# Patient Record
Sex: Female | Born: 1935 | Race: Black or African American | Hispanic: No | Smoking: Former smoker
Health system: Southern US, Community
[De-identification: ages and names within clinical notes are randomized; demographics above are authoritative.]

## PROBLEM LIST (undated history)

## (undated) DIAGNOSIS — E039 Hypothyroidism, unspecified: Secondary | ICD-10-CM

## (undated) DIAGNOSIS — G4733 Obstructive sleep apnea (adult) (pediatric): Secondary | ICD-10-CM

## (undated) DIAGNOSIS — N959 Unspecified menopausal and perimenopausal disorder: Secondary | ICD-10-CM

## (undated) DIAGNOSIS — K219 Gastro-esophageal reflux disease without esophagitis: Secondary | ICD-10-CM

## (undated) DIAGNOSIS — G4730 Sleep apnea, unspecified: Secondary | ICD-10-CM

## (undated) DIAGNOSIS — I10 Essential (primary) hypertension: Secondary | ICD-10-CM

## (undated) DIAGNOSIS — Z5189 Encounter for other specified aftercare: Secondary | ICD-10-CM

## (undated) DIAGNOSIS — M169 Osteoarthritis of hip, unspecified: Secondary | ICD-10-CM

## (undated) DIAGNOSIS — B029 Zoster without complications: Secondary | ICD-10-CM

## (undated) DIAGNOSIS — C3491 Malignant neoplasm of unspecified part of right bronchus or lung: Principal | ICD-10-CM

## (undated) DIAGNOSIS — E559 Vitamin D deficiency, unspecified: Secondary | ICD-10-CM

## (undated) DIAGNOSIS — S329XXA Fracture of unspecified parts of lumbosacral spine and pelvis, initial encounter for closed fracture: Secondary | ICD-10-CM

## (undated) DIAGNOSIS — E785 Hyperlipidemia, unspecified: Secondary | ICD-10-CM

## (undated) HISTORY — DX: Hyperlipidemia, unspecified: E78.5

## (undated) HISTORY — DX: Vitamin D deficiency, unspecified: E55.9

## (undated) HISTORY — PX: ROTATOR CUFF REPAIR: SHX139

## (undated) HISTORY — DX: Obstructive sleep apnea (adult) (pediatric): G47.33

## (undated) HISTORY — DX: Hypothyroidism, unspecified: E03.9

## (undated) HISTORY — DX: Gastro-esophageal reflux disease without esophagitis: K21.9

## (undated) HISTORY — DX: Fracture of unspecified parts of lumbosacral spine and pelvis, initial encounter for closed fracture: S32.9XXA

## (undated) HISTORY — DX: Essential (primary) hypertension: I10

## (undated) HISTORY — DX: Malignant neoplasm of unspecified part of right bronchus or lung: C34.91

## (undated) HISTORY — DX: Encounter for other specified aftercare: Z51.89

## (undated) HISTORY — PX: ABDOMINAL HYSTERECTOMY: SHX81

## (undated) HISTORY — PX: COLONOSCOPY: SHX174

## (undated) HISTORY — DX: Sleep apnea, unspecified: G47.30

## (undated) HISTORY — DX: Osteoarthritis of hip, unspecified: M16.9

## (undated) HISTORY — DX: Unspecified menopausal and perimenopausal disorder: N95.9

---

## 1961-12-29 HISTORY — PX: TOTAL ABDOMINAL HYSTERECTOMY W/ BILATERAL SALPINGOOPHORECTOMY: SHX83

## 1961-12-29 HISTORY — PX: LEG SURGERY: SHX1003

## 1999-05-15 ENCOUNTER — Ambulatory Visit (HOSPITAL_COMMUNITY): Admission: RE | Admit: 1999-05-15 | Discharge: 1999-05-15 | Payer: Self-pay | Admitting: Obstetrics and Gynecology

## 1999-05-27 ENCOUNTER — Ambulatory Visit (HOSPITAL_COMMUNITY): Admission: RE | Admit: 1999-05-27 | Discharge: 1999-05-27 | Payer: Self-pay | Admitting: Internal Medicine

## 1999-05-27 ENCOUNTER — Encounter: Payer: Self-pay | Admitting: Internal Medicine

## 1999-10-09 ENCOUNTER — Ambulatory Visit (HOSPITAL_COMMUNITY): Admission: RE | Admit: 1999-10-09 | Discharge: 1999-10-09 | Payer: Self-pay | Admitting: *Deleted

## 1999-11-17 ENCOUNTER — Emergency Department (HOSPITAL_COMMUNITY): Admission: EM | Admit: 1999-11-17 | Discharge: 1999-11-17 | Payer: Self-pay | Admitting: Internal Medicine

## 2000-10-28 LAB — HM COLONOSCOPY: HM Colonoscopy: NORMAL

## 2001-12-06 ENCOUNTER — Ambulatory Visit (HOSPITAL_COMMUNITY): Admission: RE | Admit: 2001-12-06 | Discharge: 2001-12-06 | Payer: Self-pay | Admitting: Internal Medicine

## 2001-12-06 ENCOUNTER — Encounter: Payer: Self-pay | Admitting: Internal Medicine

## 2002-04-04 ENCOUNTER — Encounter: Payer: Self-pay | Admitting: Specialist

## 2002-04-11 ENCOUNTER — Inpatient Hospital Stay (HOSPITAL_COMMUNITY): Admission: RE | Admit: 2002-04-11 | Discharge: 2002-04-12 | Payer: Self-pay | Admitting: Specialist

## 2003-01-03 ENCOUNTER — Ambulatory Visit (HOSPITAL_COMMUNITY): Admission: RE | Admit: 2003-01-03 | Discharge: 2003-01-03 | Payer: Self-pay | Admitting: Internal Medicine

## 2003-01-03 ENCOUNTER — Encounter: Payer: Self-pay | Admitting: Internal Medicine

## 2003-04-04 ENCOUNTER — Encounter: Payer: Self-pay | Admitting: Internal Medicine

## 2003-04-04 ENCOUNTER — Encounter: Admission: RE | Admit: 2003-04-04 | Discharge: 2003-04-04 | Payer: Self-pay | Admitting: Internal Medicine

## 2003-04-06 ENCOUNTER — Encounter: Admission: RE | Admit: 2003-04-06 | Discharge: 2003-04-06 | Payer: Self-pay | Admitting: Internal Medicine

## 2003-04-06 ENCOUNTER — Encounter: Payer: Self-pay | Admitting: Internal Medicine

## 2003-07-30 HISTORY — PX: LUMBAR DISC SURGERY: SHX700

## 2003-08-23 ENCOUNTER — Encounter: Payer: Self-pay | Admitting: Neurosurgery

## 2003-08-25 ENCOUNTER — Encounter: Payer: Self-pay | Admitting: Neurosurgery

## 2003-08-25 ENCOUNTER — Inpatient Hospital Stay (HOSPITAL_COMMUNITY): Admission: RE | Admit: 2003-08-25 | Discharge: 2003-08-27 | Payer: Self-pay | Admitting: Neurosurgery

## 2004-03-12 ENCOUNTER — Ambulatory Visit (HOSPITAL_COMMUNITY): Admission: RE | Admit: 2004-03-12 | Discharge: 2004-03-12 | Payer: Self-pay | Admitting: Internal Medicine

## 2004-12-12 ENCOUNTER — Ambulatory Visit: Payer: Self-pay | Admitting: Internal Medicine

## 2004-12-25 ENCOUNTER — Ambulatory Visit: Payer: Self-pay | Admitting: Pulmonary Disease

## 2004-12-25 ENCOUNTER — Ambulatory Visit (HOSPITAL_BASED_OUTPATIENT_CLINIC_OR_DEPARTMENT_OTHER): Admission: RE | Admit: 2004-12-25 | Discharge: 2004-12-25 | Payer: Self-pay | Admitting: Internal Medicine

## 2005-01-21 ENCOUNTER — Ambulatory Visit: Payer: Self-pay | Admitting: Pulmonary Disease

## 2005-06-10 ENCOUNTER — Ambulatory Visit: Payer: Self-pay | Admitting: Internal Medicine

## 2005-10-01 ENCOUNTER — Ambulatory Visit: Payer: Self-pay | Admitting: Internal Medicine

## 2005-10-13 ENCOUNTER — Ambulatory Visit: Payer: Self-pay

## 2006-02-27 ENCOUNTER — Ambulatory Visit: Payer: Self-pay | Admitting: Internal Medicine

## 2006-09-28 ENCOUNTER — Ambulatory Visit (HOSPITAL_COMMUNITY): Admission: RE | Admit: 2006-09-28 | Discharge: 2006-09-28 | Payer: Self-pay | Admitting: Internal Medicine

## 2006-11-18 ENCOUNTER — Ambulatory Visit: Payer: Self-pay | Admitting: Internal Medicine

## 2007-10-12 DIAGNOSIS — E039 Hypothyroidism, unspecified: Secondary | ICD-10-CM

## 2007-10-12 DIAGNOSIS — I10 Essential (primary) hypertension: Secondary | ICD-10-CM

## 2007-10-12 DIAGNOSIS — E7800 Pure hypercholesterolemia, unspecified: Secondary | ICD-10-CM

## 2007-10-12 HISTORY — DX: Essential (primary) hypertension: I10

## 2007-10-12 HISTORY — DX: Hypothyroidism, unspecified: E03.9

## 2008-01-27 ENCOUNTER — Ambulatory Visit: Payer: Self-pay | Admitting: Internal Medicine

## 2008-01-27 DIAGNOSIS — R21 Rash and other nonspecific skin eruption: Secondary | ICD-10-CM

## 2008-01-27 DIAGNOSIS — M79609 Pain in unspecified limb: Secondary | ICD-10-CM | POA: Insufficient documentation

## 2008-01-27 DIAGNOSIS — G4733 Obstructive sleep apnea (adult) (pediatric): Secondary | ICD-10-CM

## 2008-01-27 HISTORY — DX: Obstructive sleep apnea (adult) (pediatric): G47.33

## 2008-01-30 DIAGNOSIS — K219 Gastro-esophageal reflux disease without esophagitis: Secondary | ICD-10-CM

## 2008-01-30 DIAGNOSIS — E785 Hyperlipidemia, unspecified: Secondary | ICD-10-CM | POA: Insufficient documentation

## 2008-01-30 DIAGNOSIS — F411 Generalized anxiety disorder: Secondary | ICD-10-CM | POA: Insufficient documentation

## 2008-01-30 DIAGNOSIS — J309 Allergic rhinitis, unspecified: Secondary | ICD-10-CM | POA: Insufficient documentation

## 2008-01-30 HISTORY — DX: Gastro-esophageal reflux disease without esophagitis: K21.9

## 2008-01-30 HISTORY — DX: Hyperlipidemia, unspecified: E78.5

## 2008-02-24 ENCOUNTER — Ambulatory Visit (HOSPITAL_COMMUNITY): Admission: RE | Admit: 2008-02-24 | Discharge: 2008-02-24 | Payer: Self-pay | Admitting: Internal Medicine

## 2008-04-05 ENCOUNTER — Encounter: Payer: Self-pay | Admitting: Internal Medicine

## 2008-08-17 ENCOUNTER — Ambulatory Visit: Payer: Self-pay | Admitting: Internal Medicine

## 2008-08-17 DIAGNOSIS — N951 Menopausal and female climacteric states: Secondary | ICD-10-CM

## 2008-08-17 DIAGNOSIS — M25579 Pain in unspecified ankle and joints of unspecified foot: Secondary | ICD-10-CM | POA: Insufficient documentation

## 2008-08-17 DIAGNOSIS — R42 Dizziness and giddiness: Secondary | ICD-10-CM | POA: Insufficient documentation

## 2008-10-02 ENCOUNTER — Encounter: Payer: Self-pay | Admitting: Internal Medicine

## 2008-12-04 ENCOUNTER — Ambulatory Visit: Payer: Self-pay | Admitting: Internal Medicine

## 2008-12-04 DIAGNOSIS — R5381 Other malaise: Secondary | ICD-10-CM

## 2008-12-04 DIAGNOSIS — R5383 Other fatigue: Secondary | ICD-10-CM

## 2008-12-04 DIAGNOSIS — N959 Unspecified menopausal and perimenopausal disorder: Secondary | ICD-10-CM | POA: Insufficient documentation

## 2008-12-04 HISTORY — DX: Unspecified menopausal and perimenopausal disorder: N95.9

## 2008-12-05 LAB — CONVERTED CEMR LAB
AST: 24 units/L (ref 0–37)
Albumin: 3.7 g/dL (ref 3.5–5.2)
Basophils Relative: 1 % (ref 0.0–3.0)
CO2: 31 meq/L (ref 19–32)
Direct LDL: 123.2 mg/dL
GFR calc Af Amer: 105 mL/min
GFR calc non Af Amer: 87 mL/min
Glucose, Bld: 94 mg/dL (ref 70–99)
HCT: 42.7 % (ref 36.0–46.0)
HDL: 75.8 mg/dL (ref >39.0)
Hemoglobin: 14.2 g/dL (ref 12.0–15.0)
MCHC: 33.4 g/dL (ref 30.0–36.0)
MCV: 93.5 fL (ref 78.0–100.0)
Neutro Abs: 2.2 10*3/uL (ref 1.4–7.7)
Neutrophils Relative %: 39.6 % — ABNORMAL LOW (ref 43.0–77.0)
Potassium: 4.1 meq/L (ref 3.5–5.1)
RBC: 4.56 M/uL (ref 3.87–5.11)
Sed Rate: 17 mm/hr (ref 0–22)
Sodium: 142 meq/L (ref 135–145)
Specific Gravity, Urine: <=1.005 (ref 1.000–1.03)
Total Bilirubin: 0.9 mg/dL (ref 0.3–1.2)
Total CHOL/HDL Ratio: 2.7
Total Protein, Urine: NEGATIVE mg/dL
Urine Glucose: NEGATIVE mg/dL
Vit D, 1,25-Dihydroxy: 17 — ABNORMAL LOW (ref 30–89)
pH: 6.5 (ref 5.0–8.0)

## 2008-12-20 ENCOUNTER — Ambulatory Visit: Payer: Self-pay | Admitting: Family Medicine

## 2008-12-20 ENCOUNTER — Encounter: Payer: Self-pay | Admitting: Internal Medicine

## 2009-04-02 ENCOUNTER — Ambulatory Visit: Payer: Self-pay | Admitting: Internal Medicine

## 2009-04-02 DIAGNOSIS — J019 Acute sinusitis, unspecified: Secondary | ICD-10-CM | POA: Insufficient documentation

## 2009-04-10 ENCOUNTER — Ambulatory Visit (HOSPITAL_COMMUNITY): Admission: RE | Admit: 2009-04-10 | Discharge: 2009-04-10 | Payer: Self-pay | Admitting: Internal Medicine

## 2009-04-10 LAB — HM MAMMOGRAPHY: HM Mammogram: NEGATIVE

## 2009-06-26 ENCOUNTER — Ambulatory Visit: Payer: Self-pay | Admitting: Internal Medicine

## 2009-06-26 DIAGNOSIS — R109 Unspecified abdominal pain: Secondary | ICD-10-CM | POA: Insufficient documentation

## 2009-06-29 ENCOUNTER — Ambulatory Visit: Payer: Self-pay | Admitting: Internal Medicine

## 2009-06-29 DIAGNOSIS — M169 Osteoarthritis of hip, unspecified: Secondary | ICD-10-CM

## 2009-06-29 DIAGNOSIS — S329XXA Fracture of unspecified parts of lumbosacral spine and pelvis, initial encounter for closed fracture: Secondary | ICD-10-CM | POA: Insufficient documentation

## 2009-06-29 DIAGNOSIS — M1610 Unilateral primary osteoarthritis, unspecified hip: Secondary | ICD-10-CM

## 2009-06-29 HISTORY — DX: Fracture of unspecified parts of lumbosacral spine and pelvis, initial encounter for closed fracture: S32.9XXA

## 2009-06-29 HISTORY — DX: Unilateral primary osteoarthritis, unspecified hip: M16.10

## 2009-06-29 HISTORY — DX: Osteoarthritis of hip, unspecified: M16.9

## 2009-12-26 ENCOUNTER — Ambulatory Visit: Payer: Self-pay | Admitting: Internal Medicine

## 2009-12-26 DIAGNOSIS — E559 Vitamin D deficiency, unspecified: Secondary | ICD-10-CM | POA: Insufficient documentation

## 2009-12-26 HISTORY — DX: Vitamin D deficiency, unspecified: E55.9

## 2009-12-27 LAB — CONVERTED CEMR LAB
AST: 20 units/L (ref 0–37)
Alkaline Phosphatase: 49 units/L (ref 39–117)
BUN: 15 mg/dL (ref 6–23)
Basophils Absolute: 0.1 10*3/uL (ref 0.0–0.1)
Calcium: 9.5 mg/dL (ref 8.4–10.5)
Cholesterol: 189 mg/dL (ref 0–200)
Folate: 15.5 ng/mL
GFR calc non Af Amer: 90.01 mL/min (ref >60)
Glucose, Bld: 83 mg/dL (ref 70–99)
Hemoglobin: 14 g/dL (ref 12.0–15.0)
Lymphocytes Relative: 40.5 % (ref 12.0–46.0)
Monocytes Relative: 8.8 % (ref 3.0–12.0)
Platelets: 206 10*3/uL (ref 150.0–400.0)
RDW: 12.7 % (ref 11.5–14.6)
Saturation Ratios: 29.4 % (ref 20.0–50.0)
Sodium: 141 meq/L (ref 135–145)
Specific Gravity, Urine: <=1.005 (ref 1.000–1.030)
Total Bilirubin: 0.8 mg/dL (ref 0.3–1.2)
Total Protein, Urine: NEGATIVE mg/dL
Transferrin: 228.1 mg/dL (ref 212.0–360.0)
Urine Glucose: NEGATIVE mg/dL
Urobilinogen, UA: 0.2 (ref 0.0–1.0)
Vitamin B-12: 336 pg/mL (ref 211–911)
WBC: 6.6 10*3/uL (ref 4.5–10.5)

## 2010-09-05 ENCOUNTER — Ambulatory Visit: Payer: Self-pay | Admitting: Internal Medicine

## 2010-09-05 DIAGNOSIS — M279 Disease of jaws, unspecified: Secondary | ICD-10-CM | POA: Insufficient documentation

## 2010-09-05 DIAGNOSIS — R252 Cramp and spasm: Secondary | ICD-10-CM | POA: Insufficient documentation

## 2010-09-06 ENCOUNTER — Telehealth (INDEPENDENT_AMBULATORY_CARE_PROVIDER_SITE_OTHER): Payer: Self-pay | Admitting: *Deleted

## 2010-09-11 ENCOUNTER — Encounter: Payer: Self-pay | Admitting: Internal Medicine

## 2010-09-12 ENCOUNTER — Ambulatory Visit: Payer: Self-pay

## 2010-09-12 ENCOUNTER — Encounter: Payer: Self-pay | Admitting: Internal Medicine

## 2010-11-26 ENCOUNTER — Encounter: Payer: Self-pay | Admitting: Gastroenterology

## 2010-12-03 ENCOUNTER — Ambulatory Visit: Payer: Self-pay | Admitting: Internal Medicine

## 2010-12-03 LAB — CONVERTED CEMR LAB
BUN: 16 mg/dL (ref 6–23)
Basophils Absolute: 0.1 10*3/uL (ref 0.0–0.1)
Bilirubin, Direct: 0.1 mg/dL (ref 0.0–0.3)
Chloride: 107 meq/L (ref 96–112)
Cholesterol: 190 mg/dL (ref 0–200)
Creatinine, Ser: 0.7 mg/dL (ref 0.4–1.2)
Eosinophils Absolute: 0.2 10*3/uL (ref 0.0–0.7)
Eosinophils Relative: 3.4 % (ref 0.0–5.0)
LDL Cholesterol: 101 mg/dL — ABNORMAL HIGH (ref 0–99)
Leukocytes, UA: NEGATIVE
MCHC: 34.1 g/dL (ref 30.0–36.0)
MCV: 94.3 fL (ref 78.0–100.0)
Monocytes Absolute: 0.5 10*3/uL (ref 0.1–1.0)
Neutrophils Relative %: 34.6 % — ABNORMAL LOW (ref 43.0–77.0)
Nitrite: NEGATIVE
Platelets: 199 10*3/uL (ref 150.0–400.0)
RDW: 13.4 % (ref 11.5–14.6)
Specific Gravity, Urine: 1.015 (ref 1.000–1.030)
Total Bilirubin: 0.9 mg/dL (ref 0.3–1.2)
Total Protein, Urine: NEGATIVE mg/dL
Triglycerides: 84 mg/dL (ref 0.0–149.0)
WBC: 5.7 10*3/uL (ref 4.5–10.5)
pH: 6 (ref 5.0–8.0)

## 2010-12-04 LAB — CONVERTED CEMR LAB: Vit D, 25-Hydroxy: 33 ng/mL (ref 30–89)

## 2010-12-12 ENCOUNTER — Ambulatory Visit: Payer: Self-pay | Admitting: Internal Medicine

## 2011-01-28 NOTE — Progress Notes (Signed)
----   Converted from flag ---- ---- 09/05/2010 1:13 PM, Edman Circle wrote: APPT 9/14 @ 4:00  ---- 09/05/2010 12:14 PM, Dagoberto Reef wrote: Thanks  ---- 09/05/2010 11:58 AM, Corwin Levins MD wrote: The following orders have been entered for this patient and placed on Admin Hold:  Type:     Referral       Code:   Radiology Description:   Radiology Referral Order Date:   09/05/2010   Authorized By:   Corwin Levins MD Order #:   [id number] Clinical Notes:   Name of Test or Procedure: carotid dopplers  Of What:  Special Instructions ------------------------------

## 2011-01-28 NOTE — Miscellaneous (Signed)
Summary: Orders Update  Clinical Lists Changes  Problems: Added new problem of DIZZINESS (ICD-780.4) Orders: Added new Test order of Carotid Duplex (Carotid Duplex) - Signed 
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
## 2011-01-28 NOTE — Letter (Signed)
Summary: Colonoscopy Letter  [facility]  [address]   [place], [place] [address]   Phone: [phone]  Fax: [phone]      November 26, 2010 MRN: [id number]   [facility] [address] [place], [place]  [address]   Dear Ms. Herda,   According to your medical record, it is time for you to schedule a Colonoscopy. The American Cancer Society recommends this procedure as a method to detect early colon cancer. Patients with a family history of colon cancer, or a personal history of colon polyps or inflammatory bowel disease are at increased risk.  This letter has been generated based on the recommendations made at the time of your procedure. If you feel that in your particular situation this may no longer apply, please contact our office.  Please call our office at [phone] to schedule this appointment or to update your records at your earliest convenience.  Thank you for cooperating with [place] to provide you with the very best care possible.   Sincerely,   Barbette Hair. Arlyce Dice, M.D.  [facility] [phone]
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
## 2011-03-24 ENCOUNTER — Ambulatory Visit (INDEPENDENT_AMBULATORY_CARE_PROVIDER_SITE_OTHER): Payer: Medicare Other | Admitting: Endocrinology

## 2011-03-24 ENCOUNTER — Encounter: Payer: Self-pay | Admitting: Endocrinology

## 2011-03-24 ENCOUNTER — Other Ambulatory Visit: Payer: Medicare Other

## 2011-03-24 VITALS — BP 118/82 | HR 75 | Temp 98.6°F | Ht 60.0 in | Wt 126.8 lb

## 2011-03-24 DIAGNOSIS — N3000 Acute cystitis without hematuria: Secondary | ICD-10-CM | POA: Insufficient documentation

## 2011-03-24 LAB — POCT URINALYSIS DIPSTICK
Bilirubin, UA: NEGATIVE
Glucose, UA: NEGATIVE
Nitrite, UA: NEGATIVE
Spec Grav, UA: 1.02
Urobilinogen, UA: 0.2
pH, UA: 6

## 2011-03-24 MED ORDER — CIPROFLOXACIN HCL 500 MG PO TABS: 500.0000 mg | ORAL_TABLET | Freq: Two times a day (BID) | ORAL | Status: AC
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
## 2011-03-24 NOTE — Patient Instructions (Signed)
i have sent a prescription for "cipro" to [facility]. Please call in 4 days if your symptoms are not better

## 2011-03-24 NOTE — Progress Notes (Signed)
Addended by: Romero Belling on: 03/24/2011 02:30 PM   Modules accepted: Orders

## 2011-05-16 NOTE — Op Note (Signed)
[facility]  Patient:    [name], [name] Visit Number: [id number] MRN: [id number]          Service Type: SUR Location: 4W [id number] 02 Attending Physician:  Rocky Crafts Dictated by:   Michael Litter. Montez Morita, M.D. Proc. Date: 04/11/02 Admit Date:  04/11/2002                             Operative Report  PREOPERATIVE DIAGNOSIS:  Rotator cuff tear of right shoulder.  POSTOPERATIVE DIAGNOSIS:  Rotator cuff tear of right shoulder.  OPERATION PROCEDURE:  Anterior acromioplasty and rotator cuff repair.  SURGEON:  Philips J. Montez Morita, M.D.  ASSISTANT:  Ottie Glazier. Wynona Neat, P.A.-C.  DESCRIPTION OF PROCEDURE:  After suitable general anesthesia, he was positioned on the shoulder frame, prepped and draped routinely.  An incision was made over the anterior acromion extending from the distal clavicle to an inch to the deltoid.  The deltoid is removed from the anterior acromion along with the coracoacromial ligament.  A saw was used to bevel back the anterior acromion.  The coracoacromial ligament is then loosened distally with a Bovie of its attachment to the coracoid for ________ reattachment and lengthening. The cuff repair involved the supraspinatus, coming from the biceps tendon which was exposed distally with a little piece of it remaining on the distal area.  The area of the bone was denuded with the bur.  Two drill holes were placed and connected with two other drill holds and a towel clip.  A locking mattress suture was placed in the tendon and brought out and pulled down until it trough very nicely with #1 Ethibond.  A #1 PDS was used to repair the deltoid back to the acromion through two drill holes with two sutures through each drill hole, and in the deltoid roof, some 2-0 coated Vicryl in the subcutaneous and Monocryl on the skin.  Tolerated well and goes to recovery in an abduction pillow. Dictated by:   [employer]. Montez Morita, M.D. Attending Physician:   Rocky Crafts DD:  04/11/02 TD:  04/11/02 Job: [id number]

## 2011-05-16 NOTE — Op Note (Signed)
[name], [name]                        ACCOUNT NO.:  [id number]   MEDICAL RECORD NO.:  [id number]                   PATIENT TYPE:  INP   LOCATION:  2899                                 FACILITY:  [facility]   PHYSICIAN:  [name]. [name], M.D.               DATE OF BIRTH:  02/09/1933   DATE OF PROCEDURE:  08/25/2003  DATE OF DISCHARGE:                                 OPERATIVE REPORT   PREOPERATIVE DIAGNOSIS:  Herniated lumbar disk L5-S1 left with spondylosis,  stenosis, degenerative disk disease, and lumbar radiculopathy at L4-5 and L5-  S1 levels.   POSTOPERATIVE DIAGNOSIS:  Herniated lumbar disk L5-S1 left with spondylosis,  stenosis, degenerative disk disease, and lumbar radiculopathy at L4-5 and L5-  S1 levels.   PROCEDURE:  1. Left L4-5 foraminotomy.  2. Left L5-S1 microdiskectomy.  3. Microdissection.   SURGEON:  [name]. [name], M.D.   ASSISTANT:  Payton Doughty, M.D.   ANESTHESIA:  General endotracheal anesthesia.   ESTIMATED BLOOD LOSS:  Minimal.   COMPLICATIONS:  None.   DISPOSITION:  Recovery.   INDICATIONS FOR PROCEDURE:  The patient is a 77 year old woman with lumbar  degenerative changes at L4-5. She has mild spondylolisthesis with foraminal  stenosis left greater than right.  The spondylolisthesis is fixed on flexion  and extension views of her lumbar spine at the L5-S1 level.  She has a focal  disk herniation which is causing significant S1 nerve root compression.  It  was elected to take her to surgery for decompression at each of these  effected levels.   DESCRIPTION OF PROCEDURE:  The patient is brought to the operating room.  Following the satisfactory and uncomplicated induction of general  endotracheal anesthesia and placement of intravenous lines, the patient was  placed in a prone position on the operating table in the Wilson frame.  Her  low back was then prepped and draped in the usual sterile fashion.  An area  of plane incision was  infiltrated with 0.25% Marcaine and 0.5% lidocaine  with 1:200,000 epinephrine.  Incision was made in the midline, carried  through adipose tissue to the lumbodorsal fascia which was incised on the  left side of the midline. Subperiosteal dissection was performed exposing  the L4-5 and L5-S1 interspaces on the left side of the midline.  Self-  retaining retractor was placed.  Intraoperative x-ray confirmed correct  level. Using the Ansbacher Micro max drill and Hellenes bur, hemi semi  laminectomy of L4 and of L5 was performed. This was completed with Kerrison  rongeurs.  At both of these levels, there was extensively hypertrophied  ligamentum flavum which was disconnected and removed resulting in  decompression of the thecal sac and lateral recesses.  The L5 nerve was  decompressed.  This was done under microscopic visualization and the lateral  recess was decompressed with resultant relief of compression of the lateral  thecal sac and  L5 nerve root.  The facet joint appeared to be highly  degenerated. Care was taken to restrict the medial facetectomy as much as  possible.  Subsequently, the laminotomy was completed over the L5-S1 level  using microdissection technique with the use of the intraoperative  microscope. The S1 nerve root was then mobilized medially.  This exposed a  thinly contained fragment of herniated disk material which was directly  beneath the takeoff of the S1 nerve root.  The annulus was entered with a #4  Penfield and multiple fragments of disk material were removed. The  interspace was then cleared of residual disk material using a variety of  Epstein curets and pituitary rongeurs.  The lateral extent of the disk space  was also decompressed.  The redundant annulus was cauterized with bipolar  electrocautery.  The nerve root was well decompressed as was the thecal sac.  Hemostasis was assured with Gelfoam soaked in thrombin and the wound was  irrigated with  Bacitracin irrigation.  The lumbodorsal fascia was then  closed with 0 Vicryl suture.  Subcutaneous tissues were reapproximated with  2-0 Vicryl interrupted and inverted sutures and the skin edges were  reapproximated with interrupted 3-0 Vicryl subcuticular stitch.  The wound  was dressed with Dermabond. The patient was extubated in the operating room  and taken to the recovery room in stable and satisfactory condition having  tolerated the operative procedure well.                                               [name]. [name], M.D.    JDS/MEDQ  D:  08/25/2003  T:  08/26/2003  Job:  [id number]

## 2011-05-16 NOTE — H&P (Signed)
[facility]  Patient:    [name], [name] Visit Number: [id number] MRN: [id number]          Service Type: Attending:  Philips J. Montez Morita, M.D. Dictated by:   Sammuel Cooper. Mahar, P.A. Adm. Date:  04/11/02                           History and Physical  DATE OF BIRTH:  2033-05-21  CHIEF COMPLAINT:  Right shoulder pain.  HISTORY OF PRESENT ILLNESS:  The patient is a 77 year old female with right shoulder pain since a fall in February of 2003.  MRI shows a rotator cuff tear.  She has failed conservative management.  It has gotten to the point that it is significantly effecting her activities of daily living and quality of life.  The risks and benefits of the surgery were discussed with the patient by Philips J. Montez Morita, M.D., as well as myself.  She indicated an understanding and does wish to proceed.  ALLERGIES:  No known drug allergies.  MEDICATIONS: 1. Lisinopril 20 mg p.o. q.d. 2. Levoxyl 10 mg p.o. q.d. 3. Premarin 0.625 mg p.o. q.d. 4. Celebrex 200 mg p.o. q.d. 5. Vicodin p.r.n.  PAST MEDICAL HISTORY:  Significant for: 1. Hypertension. 2. Hypercholesterolemia.  PAST SURGICAL HISTORY:  Significant for: 1. Hysterectomy approximately 20 years ago. 2. Oral surgery in April of 2003.  SOCIAL HISTORY:  Denies tobacco use.  Denies alcohol use.  She is married. She has six grown children.  Her husband will be available to help her postoperatively.  He is in good health.  FAMILY MEDICAL HISTORY:  Mother alive at age 8 with a history of stroke. Father deceased at age 28 secondary to lung cancer.  REVIEW OF SYSTEMS:  The patient denies any fevers, chills, sweats, or bleeding tendencies.  CNS:  Denies blurred vision, seizure, headache, or paralysis. Cardiovascular:  Denies chest pain, orthopnea, claudication, palpitations, or angina.  Pulmonary:  Denies shortness of breath, productive cough, or hemoptysis.  GI:  Denies nausea, vomiting, constipation,  diarrhea, melena, or bloody stool.  GU:  Denies dysuria, hematuria, discharge, or frequency.  PHYSICAL EXAMINATION:  VITAL SIGNS:  Blood pressure 160/86, respirations 16 and unlabored, pulse 64 and regular.  GENERAL APPEARANCE:  The patient is a 77 year old female who was alert and oriented and in no acute distress.  Well nourished and well groomed.  She appears much younger than her stated age.  She is very pleasant and cooperative to examination.  HEENT:  The head is normocephalic and atraumatic.  Extraocular movements intact.  Pupils equal, round, and reactive to light.  Nares patent bilaterally.  The pharynx is clear without any erythema or exudate.  NECK:  Soft and supple to palpation.  No bruits appreciated.  No lymphadenopathy or thyromegaly is noted.  CHEST:  Clear to auscultation bilaterally.  No rhonchi, rales, wheezes, stridor, or friction rubs.  BREASTS:  Not pertinent and not performed.  HEART:  S1 and S2 regular rate and rhythm.  No murmurs, rubs, or gallops noted.  ABDOMEN:  Soft to palpation.  Positive bowel sounds.  Nontender and nondistended.  No organomegaly noted.  GENITOURINARY:  Not pertinent and not performed.  EXTREMITIES:  Right upper extremity with painful range of motion.  Distal motor function is intact and equal bilaterally.  Sensation is intact to light touch.  Neurovascular intact.  Radial pulses are intact and equal bilaterally.  SKIN:  Intact without any lesions or rashes.  RADIOLOGY:  The MRI shows a small rotator cuff tear.  IMPRESSION: 1. Right rotator cuff tear. 2. Hypertension. 3. Hypercholesterolemia.  PLAN: 1. Admit to [facility] on April 11, 2002, to [employer]. Montez Morita,    M.D., for a right rotator cuff repair. 2. The patients primary care physician is Corwin Levins, M.D.  He has    forwarded a letter of medical clearance indicating that she is all right to    undergo surgery. Dictated by:   Sammuel Cooper. Mahar,  P.A. Attending:  Philips J. Montez Morita, M.D. DD:  04/06/02 TD:  04/06/02 Job: [id number]

## 2011-05-16 NOTE — Procedures (Signed)
[name], [name]              ACCOUNT NO.:  [id number]   MEDICAL RECORD NO.:  [id number]          PATIENT TYPE:  OUT   LOCATION:  SLEEP CENTER                 FACILITY:  [facility]   PHYSICIAN:  Marcelyn Bruins, M.D. [facility] DATE OF BIRTH:  June 12, 1933   DATE OF STUDY:  12/25/2004                              NOCTURNAL POLYSOMNOGRAM   REFERRING PHYSICIAN:  Dr. Oliver Barre   INDICATION FOR THE STUDY:  Hypersomnia with sleep apnea.   SLEEP ARCHITECTURE:  The patient had a total sleep time of 331 minutes with  decreased REM and never achieved slow wave sleep. Sleep onset latency was  prolonged at 107 minutes; however, REM onset was normal.   IMPRESSION:  1.  Mild to moderate obstructive sleep apnea/hypopnea syndrome with O2      desaturation as low as 78%. The events were clearly worse during REM and      some of them were somewhat prolonged.  2.  Moderate snoring noted throughout the study.  3.  No clinically significant cardiac arrhythmia.      KC/MEDQ  D:  01/02/2005 12:07:01  T:  01/02/2005 12:27:00  Job:  [id number]

## 2012-01-01 ENCOUNTER — Other Ambulatory Visit: Payer: Self-pay | Admitting: Internal Medicine

## 2012-01-08 ENCOUNTER — Ambulatory Visit (INDEPENDENT_AMBULATORY_CARE_PROVIDER_SITE_OTHER): Payer: Medicare Other

## 2012-01-08 DIAGNOSIS — Z23 Encounter for immunization: Secondary | ICD-10-CM

## 2012-03-03 ENCOUNTER — Encounter: Payer: Self-pay | Admitting: Internal Medicine

## 2012-03-03 ENCOUNTER — Ambulatory Visit (INDEPENDENT_AMBULATORY_CARE_PROVIDER_SITE_OTHER): Payer: Medicare Other | Admitting: Internal Medicine

## 2012-03-03 VITALS — BP 122/78 | HR 77 | Temp 98.2°F | Ht 60.0 in | Wt 128.5 lb

## 2012-03-03 DIAGNOSIS — Z0000 Encounter for general adult medical examination without abnormal findings: Secondary | ICD-10-CM

## 2012-03-03 DIAGNOSIS — I10 Essential (primary) hypertension: Secondary | ICD-10-CM

## 2012-03-03 DIAGNOSIS — M25519 Pain in unspecified shoulder: Secondary | ICD-10-CM

## 2012-03-03 DIAGNOSIS — M25511 Pain in right shoulder: Secondary | ICD-10-CM

## 2012-03-03 DIAGNOSIS — J029 Acute pharyngitis, unspecified: Secondary | ICD-10-CM | POA: Insufficient documentation

## 2012-03-03 DIAGNOSIS — E039 Hypothyroidism, unspecified: Secondary | ICD-10-CM

## 2012-03-03 MED ORDER — HYDROCODONE-HOMATROPINE 5-1.5 MG/5ML PO SYRP: 5.0000 mL | ORAL_SOLUTION | Freq: Four times a day (QID) | ORAL | Status: AC | PRN

## 2012-03-03 MED ORDER — AZITHROMYCIN 250 MG PO TABS: ORAL_TABLET | ORAL | Status: AC

## 2012-03-03 NOTE — Patient Instructions (Signed)
Take all new medications as prescribed Continue all other medications as before You will be contacted regarding the referral for: orthopedic ([facility]) Please return in 3 months, or sooner if needed

## 2012-03-07 ENCOUNTER — Encounter: Payer: Self-pay | Admitting: Internal Medicine

## 2012-03-07 DIAGNOSIS — Z0000 Encounter for general adult medical examination without abnormal findings: Secondary | ICD-10-CM | POA: Insufficient documentation

## 2012-03-07 NOTE — Assessment & Plan Note (Signed)
stable overall by hx and exam, most recent data reviewed with pt, and pt to continue medical treatment as before BP Readings from Last 3 Encounters:  03/03/12 122/78  03/24/11 118/82  12/12/10 126/82

## 2012-03-07 NOTE — Assessment & Plan Note (Signed)
Mild to mod, for antibx course,  to f/u any worsening symptoms or concerns, also cough med prn 

## 2012-03-07 NOTE — Assessment & Plan Note (Addendum)
Mild to mod, for pain control,  to f/u any worsening symptoms or concerns, refer orthopedic for further eval and tx

## 2012-03-07 NOTE — Assessment & Plan Note (Signed)
stable overall by hx and exam, most recent data reviewed with pt, and pt to continue medical treatment as before  Lab Results  Component Value Date   TSH 2.00 12/03/2010
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
## 2012-03-10 DIAGNOSIS — M25519 Pain in unspecified shoulder: Secondary | ICD-10-CM | POA: Diagnosis not present

## 2012-03-19 ENCOUNTER — Other Ambulatory Visit: Payer: Self-pay | Admitting: Internal Medicine

## 2012-03-29 ENCOUNTER — Other Ambulatory Visit: Payer: Self-pay | Admitting: *Deleted

## 2012-03-29 MED ORDER — LEVOTHYROXINE SODIUM 75 MCG PO TABS: 75.0000 ug | ORAL_TABLET | Freq: Every day | ORAL | Status: DC

## 2012-03-29 MED ORDER — MONTELUKAST SODIUM 10 MG PO TABS: 10.0000 mg | ORAL_TABLET | Freq: Every day | ORAL | Status: DC

## 2012-03-29 MED ORDER — ATORVASTATIN CALCIUM 40 MG PO TABS: 40.0000 mg | ORAL_TABLET | Freq: Every day | ORAL | Status: DC

## 2012-03-29 MED ORDER — BUDESONIDE 32 MCG/ACT NA SUSP: 1.0000 | Freq: Every day | NASAL | Status: DC

## 2012-03-29 NOTE — Telephone Encounter (Signed)
Left msg on triage needing written rx on all meds except for Lisinopril. Want to come by and pick rx's up. Pls call when ready... 03/29/12@4 :58pm/LMB

## 2012-03-29 NOTE — Telephone Encounter (Signed)
Called the patient [facility]

## 2012-05-06 ENCOUNTER — Other Ambulatory Visit: Payer: Self-pay

## 2012-05-06 MED ORDER — ESTROGENS CONJUGATED 0.3 MG PO TABS: 0.3000 mg | ORAL_TABLET | Freq: Every day | ORAL | Status: DC

## 2012-05-06 NOTE — Telephone Encounter (Signed)
Pt informed

## 2012-06-03 ENCOUNTER — Ambulatory Visit: Payer: Medicare Other | Admitting: Internal Medicine

## 2012-06-09 ENCOUNTER — Ambulatory Visit (INDEPENDENT_AMBULATORY_CARE_PROVIDER_SITE_OTHER): Payer: Medicare Other | Admitting: Internal Medicine

## 2012-06-09 ENCOUNTER — Other Ambulatory Visit (INDEPENDENT_AMBULATORY_CARE_PROVIDER_SITE_OTHER): Payer: Medicare Other

## 2012-06-09 ENCOUNTER — Encounter: Payer: Self-pay | Admitting: Internal Medicine

## 2012-06-09 VITALS — BP 122/72 | HR 68 | Temp 98.0°F | Ht 60.0 in | Wt 129.0 lb

## 2012-06-09 DIAGNOSIS — E559 Vitamin D deficiency, unspecified: Secondary | ICD-10-CM | POA: Insufficient documentation

## 2012-06-09 DIAGNOSIS — E785 Hyperlipidemia, unspecified: Secondary | ICD-10-CM | POA: Diagnosis not present

## 2012-06-09 DIAGNOSIS — E039 Hypothyroidism, unspecified: Secondary | ICD-10-CM

## 2012-06-09 DIAGNOSIS — M899 Disorder of bone, unspecified: Secondary | ICD-10-CM | POA: Diagnosis not present

## 2012-06-09 DIAGNOSIS — I10 Essential (primary) hypertension: Secondary | ICD-10-CM

## 2012-06-09 DIAGNOSIS — M8580 Other specified disorders of bone density and structure, unspecified site: Secondary | ICD-10-CM | POA: Insufficient documentation

## 2012-06-09 LAB — CBC WITH DIFFERENTIAL/PLATELET
Eosinophils Relative: 2.5 % (ref 0.0–5.0)
HCT: 41.4 % (ref 36.0–46.0)
Hemoglobin: 13.6 g/dL (ref 12.0–15.0)
Lymphs Abs: 2.6 10*3/uL (ref 0.7–4.0)
MCV: 94.4 fl (ref 78.0–100.0)
Monocytes Absolute: 0.5 10*3/uL (ref 0.1–1.0)
Neutro Abs: 3.3 10*3/uL (ref 1.4–7.7)
Platelets: 190 10*3/uL (ref 150.0–400.0)
RDW: 14 % (ref 11.5–14.6)
WBC: 6.7 10*3/uL (ref 4.5–10.5)

## 2012-06-09 LAB — URINALYSIS, ROUTINE W REFLEX MICROSCOPIC
Bilirubin Urine: NEGATIVE
Ketones, ur: NEGATIVE
Nitrite: NEGATIVE

## 2012-06-09 LAB — BASIC METABOLIC PANEL
GFR: 100.98 mL/min (ref >60.00)
Potassium: 4.3 mEq/L (ref 3.5–5.1)
Sodium: 142 mEq/L (ref 135–145)

## 2012-06-09 LAB — LIPID PANEL
LDL Cholesterol: 46 mg/dL (ref 0–99)
VLDL: 27.6 mg/dL (ref 0.0–40.0)

## 2012-06-09 LAB — HEPATIC FUNCTION PANEL
AST: 18 U/L (ref 0–37)
Alkaline Phosphatase: 57 U/L (ref 39–117)
Total Bilirubin: 0.7 mg/dL (ref 0.3–1.2)

## 2012-06-09 NOTE — Assessment & Plan Note (Signed)
stable overall by hx and exam, most recent data reviewed with pt, and pt to continue medical treatment as before Lab Results  Component Value Date   TSH 2.00 12/03/2010   For tsh todya

## 2012-06-09 NOTE — Patient Instructions (Signed)
Continue all other medications as before Please schedule the bone density test with the schedulers as you leave today Please go to LAB in the Basement for the blood and/or urine tests to be done today \\You  will be contacted by phone if any changes need to be made immediately.  Otherwise, you will receive a letter about your results with an explanation. Please return in 1 year for your yearly visit, or sooner if needed

## 2012-06-09 NOTE — Assessment & Plan Note (Signed)
stable overall by hx and exam, most recent data reviewed with pt, and pt to continue medical treatment as before BP Readings from Last 3 Encounters:  06/09/12 122/72  03/03/12 122/78  03/24/11 118/82
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
## 2012-06-09 NOTE — Assessment & Plan Note (Signed)
For dxa as she is due 

## 2012-06-09 NOTE — Assessment & Plan Note (Signed)
stable overall by hx and exam, most recent data reviewed with pt, and pt to continue medical treatment as before For vit d check

## 2012-06-09 NOTE — Assessment & Plan Note (Signed)
Lab Results  Component Value Date   LDLCALC 101* 12/03/2010   stable overall by hx and exam, most recent data reviewed with pt, and pt to continue medical treatment as before

## 2012-06-10 ENCOUNTER — Encounter: Payer: Self-pay | Admitting: Internal Medicine

## 2012-06-28 ENCOUNTER — Inpatient Hospital Stay: Admission: RE | Admit: 2012-06-28 | Payer: Medicare Other | Source: Ambulatory Visit

## 2012-07-06 ENCOUNTER — Ambulatory Visit (INDEPENDENT_AMBULATORY_CARE_PROVIDER_SITE_OTHER)
Admission: RE | Admit: 2012-07-06 | Discharge: 2012-07-06 | Disposition: A | Discharge status: Home / Self Care | Payer: Medicare Other | Source: Ambulatory Visit | Attending: Internal Medicine | Admitting: Internal Medicine

## 2012-07-06 DIAGNOSIS — M899 Disorder of bone, unspecified: Secondary | ICD-10-CM

## 2012-07-06 DIAGNOSIS — M949 Disorder of cartilage, unspecified: Secondary | ICD-10-CM | POA: Diagnosis not present

## 2012-07-06 DIAGNOSIS — M8580 Other specified disorders of bone density and structure, unspecified site: Secondary | ICD-10-CM

## 2012-07-21 ENCOUNTER — Encounter: Payer: Self-pay | Admitting: Internal Medicine

## 2012-08-05 ENCOUNTER — Ambulatory Visit: Payer: Medicare Other | Admitting: Internal Medicine

## 2012-08-17 ENCOUNTER — Telehealth: Payer: Self-pay | Admitting: Internal Medicine

## 2012-08-17 NOTE — Telephone Encounter (Signed)
Caller: [name]/Spouse; Patient Name: [name]; PCP: Oliver Barre; Best Callback Phone Number: [phone].  Caller requests referral for testing/ referral  regarding her sleep apnea.  Onset symptoms "for years".  Emergent symptoms ruled out.  Home care for the interim and see provider in 24 hours per Sleep Disorders protocol.  Caller agreed.  Appointment with Dr. Jonny Ruiz on 08/18/12 @ 11:15 am.  (Patient is out of town for the rest of the day.)

## 2012-08-18 ENCOUNTER — Encounter: Payer: Self-pay | Admitting: Internal Medicine

## 2012-08-18 ENCOUNTER — Ambulatory Visit (INDEPENDENT_AMBULATORY_CARE_PROVIDER_SITE_OTHER): Payer: Medicare Other | Admitting: Internal Medicine

## 2012-08-18 VITALS — BP 140/78 | HR 61 | Temp 96.7°F | Ht 60.0 in | Wt 133.4 lb

## 2012-08-18 DIAGNOSIS — G4733 Obstructive sleep apnea (adult) (pediatric): Secondary | ICD-10-CM

## 2012-08-18 DIAGNOSIS — I10 Essential (primary) hypertension: Secondary | ICD-10-CM | POA: Diagnosis not present

## 2012-08-18 DIAGNOSIS — F411 Generalized anxiety disorder: Secondary | ICD-10-CM

## 2012-08-18 DIAGNOSIS — K219 Gastro-esophageal reflux disease without esophagitis: Secondary | ICD-10-CM | POA: Diagnosis not present

## 2012-08-18 NOTE — Patient Instructions (Addendum)
You will be contacted regarding the referral for: pulmonary Continue all other medications as before Please return in 6 months, or sooner if needed (the office will call)

## 2012-08-22 ENCOUNTER — Encounter: Payer: Self-pay | Admitting: Internal Medicine

## 2012-08-22 NOTE — Assessment & Plan Note (Signed)
stable overall by hx and exam, , and pt to continue medical treatment as before   

## 2012-08-22 NOTE — Assessment & Plan Note (Signed)
Pt now wanting tx, for pulm referral

## 2012-08-22 NOTE — Assessment & Plan Note (Signed)
stable overall by hx and exam, most recent data reviewed with pt, and pt to continue medical treatment as before BP Readings from Last 3 Encounters:  08/18/12 140/78  06/09/12 122/72  03/03/12 122/78

## 2012-08-22 NOTE — Assessment & Plan Note (Signed)
stable overall by hx and exam, most recent data reviewed with pt, and pt to continue medical treatment as before Lab Results  Component Value Date   WBC 6.7 06/09/2012   HGB 13.6 06/09/2012   HCT 41.4 06/09/2012   PLT 190.0 06/09/2012   GLUCOSE 78 06/09/2012   CHOL 155 06/09/2012   TRIG 138.0 06/09/2012   HDL 81.00 06/09/2012   LDLDIRECT 123.2 12/04/2008   LDLCALC 46 06/09/2012   ALT 16 06/09/2012   AST 18 06/09/2012   NA 142 06/09/2012   K 4.3 06/09/2012   CL 106 06/09/2012   CREATININE 0.7 06/09/2012   BUN 11 06/09/2012   CO2 29 06/09/2012   TSH 2.61 06/09/2012
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
## 2012-08-31 ENCOUNTER — Encounter: Payer: Self-pay | Admitting: Gastroenterology

## 2012-09-09 ENCOUNTER — Encounter: Payer: Self-pay | Admitting: Pulmonary Disease

## 2012-09-09 ENCOUNTER — Ambulatory Visit (INDEPENDENT_AMBULATORY_CARE_PROVIDER_SITE_OTHER): Payer: Medicare Other | Admitting: Pulmonary Disease

## 2012-09-09 VITALS — BP 120/82 | HR 64 | Temp 97.5°F | Ht 60.0 in | Wt 131.8 lb

## 2012-09-09 DIAGNOSIS — G4733 Obstructive sleep apnea (adult) (pediatric): Secondary | ICD-10-CM

## 2012-09-09 NOTE — Progress Notes (Deleted)
  Subjective:    Patient ID: [name], female    DOB: [birth year], 78 y.o.   MRN: [id number]  HPI    Review of Systems  Constitutional: Negative for fever, appetite change and unexpected weight change.  HENT: Negative for ear pain, congestion, sore throat, sneezing and dental problem.   Respiratory: Positive for shortness of breath. Negative for cough.   Cardiovascular: Negative for chest pain, palpitations and leg swelling.  Gastrointestinal: Negative for abdominal pain.  Musculoskeletal: Negative for joint swelling.  Skin: Negative for rash.  Neurological: Negative for headaches.  Psychiatric/Behavioral: Negative for dysphoric mood. The patient is not nervous/anxious.        Objective:   Physical Exam        Assessment & Plan:

## 2012-09-09 NOTE — Patient Instructions (Signed)
Will schedule sleep study Will call to schedule follow up after sleep study reviewed 

## 2012-09-09 NOTE — Assessment & Plan Note (Signed)
She has prior history of sleep apnea, but has never been on therapy for this.  She has history of hypertension and anxiety.  She has continued snoring, apnea, and daytime sleepiness.  I am concerned she still has sleep apnea.  I have explained how sleep apnea can affect the patient's health.  Driving precautions and importance of weight loss were discussed.  Treatment options for sleep apnea were reviewed.  She is concerned about her ability to use CPAP.  I have asked her to keep an open mind about this.  She may be a good candidate for an oral appliance, but would need a dental evaluation prior to getting fitted for this.
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
## 2012-09-17 ENCOUNTER — Other Ambulatory Visit: Payer: Self-pay | Admitting: Internal Medicine

## 2012-09-22 ENCOUNTER — Other Ambulatory Visit: Payer: Self-pay | Admitting: Internal Medicine

## 2012-10-03 ENCOUNTER — Ambulatory Visit (HOSPITAL_BASED_OUTPATIENT_CLINIC_OR_DEPARTMENT_OTHER): Payer: Medicare Other | Attending: Pulmonary Disease

## 2012-10-03 VITALS — Ht 60.0 in | Wt 130.0 lb

## 2012-10-03 DIAGNOSIS — Z79899 Other long term (current) drug therapy: Secondary | ICD-10-CM | POA: Insufficient documentation

## 2012-10-03 DIAGNOSIS — I10 Essential (primary) hypertension: Secondary | ICD-10-CM | POA: Insufficient documentation

## 2012-10-03 DIAGNOSIS — G4730 Sleep apnea, unspecified: Secondary | ICD-10-CM | POA: Insufficient documentation

## 2012-10-03 DIAGNOSIS — G4733 Obstructive sleep apnea (adult) (pediatric): Secondary | ICD-10-CM

## 2012-10-03 DIAGNOSIS — F411 Generalized anxiety disorder: Secondary | ICD-10-CM | POA: Insufficient documentation

## 2012-10-06 ENCOUNTER — Telehealth: Payer: Self-pay | Admitting: Pulmonary Disease

## 2012-10-06 DIAGNOSIS — G4733 Obstructive sleep apnea (adult) (pediatric): Secondary | ICD-10-CM | POA: Diagnosis not present

## 2012-10-06 NOTE — Telephone Encounter (Signed)
I spoke with pt and she is scheduled to come in 10/25/12 at 1:30

## 2012-10-06 NOTE — Telephone Encounter (Signed)
PSG 10/03/12>>AHI 23.4, SpO2 low 72%.  Complex sleep apnea.  PLMI 1.  Will have my nurse schedule ROV to discuss results (ok to double book visit).

## 2012-10-07 NOTE — Procedures (Signed)
NAMEJESS, MOTE              ACCOUNT NO.:  [id number]  MEDICAL RECORD NO.:  [id number]          PATIENT TYPE:  OUT  LOCATION:  SLEEP CENTER                 FACILITY:  [facility]  PHYSICIAN:  [name], MD        DATE OF BIRTH:  06/06/1933  DATE OF STUDY:  10/03/2012                           NOCTURNAL POLYSOMNOGRAM  REFERRING PHYSICIAN:  [name], MD  FACILITY:  [facility].  INDICATION FOR STUDY:  Ms. Wahlert is a 78 year old female who has history of hypertension and anxiety.  She also had a previous sleep study from December 25, 2004, which showed mild-to-moderate obstructive sleep apnea.  She was never tried on therapy for this.  She has continued snoring, sleep disruption, and daytime sleepiness.  She was therefore referred back to Sleep Lab for further evaluation of hypersomnia with obstructive sleep apnea.  Height is 60 inches, weight is 130 pounds, BMI is 25, neck size is 13.5 inches.  EPWORTH SLEEPINESS SCORE:  17.  MEDICATIONS:  Lisinopril, Synthroid, Lipitor, aspirin, Premarin, and levothyroxine.  SLEEP ARCHITECTURE:  Total recording time was 354 minutes, total sleep time was 300 minutes, sleep efficiency was 82%, sleep latency was 29 minutes, REM latency was 6 minutes.  The patient was observed in all stages of sleep and slept predominantly in the nonsupine position.  RESPIRATORY DATA:  The average respiratory rate was 14.  Moderate snoring was noted by the technician.  The overall apnea-hypopnea index was 23.4.  Both central and obstructive events were noted.  OXYGEN DATA:  The baseline oxygenation was 96%.  The oxygen saturation nadir was 72%.  The study was conducted without the use of supplemental oxygen.  CARDIAC DATA:  The average heart rate was 58 and the rhythm strip showed sinus rhythm.  MOVEMENT-PARASOMNIA:  The periodic limb movement index was 0 and the patient had 1 restroom trip.  IMPRESSIONS-RECOMMENDATIONS:  This study shows  evidence for moderate complex sleep apnea with an overall apnea-hypopnea index of 23.4 and oxygen saturation nadir of 72%.  I would recommend the patient be referred back to the Sleep Lab for a CPAP titration study.     [name], MD Diplomat, American Board of Sleep Medicine    VS/MEDQ  D:  10/06/2012 13:33:16  T:  10/07/2012 05:00:48  Job:  [id number]

## 2012-10-25 ENCOUNTER — Ambulatory Visit (INDEPENDENT_AMBULATORY_CARE_PROVIDER_SITE_OTHER): Payer: Medicare Other | Admitting: Pulmonary Disease

## 2012-10-25 ENCOUNTER — Encounter: Payer: Self-pay | Admitting: Pulmonary Disease

## 2012-10-25 VITALS — BP 132/82 | HR 68 | Temp 98.3°F | Ht 60.0 in | Wt 134.0 lb

## 2012-10-25 DIAGNOSIS — G4731 Primary central sleep apnea: Secondary | ICD-10-CM

## 2012-10-25 DIAGNOSIS — G4730 Sleep apnea, unspecified: Secondary | ICD-10-CM

## 2012-10-25 NOTE — Assessment & Plan Note (Signed)
She has moderate complex sleep apnea.  I have reviewed her sleep test results with her.  Explained how sleep apnea can affect the patient's health.  Driving precautions and importance of weight loss were discussed.  Treatment options for sleep apnea were reviewed.  Will need to arrange for in lab CPAP titration study.
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
## 2012-10-25 NOTE — Patient Instructions (Signed)
Will arrange for sleep study Will call to arrange for follow up after sleep study reviewed 

## 2012-11-01 ENCOUNTER — Ambulatory Visit (HOSPITAL_BASED_OUTPATIENT_CLINIC_OR_DEPARTMENT_OTHER): Payer: Medicare Other | Attending: Pulmonary Disease

## 2012-11-01 VITALS — Ht 60.0 in | Wt 132.0 lb

## 2012-11-01 DIAGNOSIS — G4733 Obstructive sleep apnea (adult) (pediatric): Secondary | ICD-10-CM

## 2012-11-01 DIAGNOSIS — Z79899 Other long term (current) drug therapy: Secondary | ICD-10-CM | POA: Insufficient documentation

## 2012-11-01 DIAGNOSIS — G4730 Sleep apnea, unspecified: Secondary | ICD-10-CM | POA: Insufficient documentation

## 2012-11-01 DIAGNOSIS — G4731 Primary central sleep apnea: Secondary | ICD-10-CM

## 2012-11-11 DIAGNOSIS — G4733 Obstructive sleep apnea (adult) (pediatric): Secondary | ICD-10-CM

## 2012-11-11 DIAGNOSIS — G4730 Sleep apnea, unspecified: Secondary | ICD-10-CM

## 2012-11-17 ENCOUNTER — Telehealth: Payer: Self-pay | Admitting: Pulmonary Disease

## 2012-11-17 DIAGNOSIS — G4731 Primary central sleep apnea: Secondary | ICD-10-CM

## 2012-11-17 NOTE — Telephone Encounter (Signed)
CPAP 11/01/12>>CPAP 7 cm H2O>>AHI 2.2, +R.  Discussed with pt.  Will arrange for CPAP 7 cm H2O.  Will have my nurse schedule ROV 2 months after CPAP set up.

## 2012-11-18 NOTE — Procedures (Signed)
[name], [name]              ACCOUNT NO.:  [id number]  MEDICAL RECORD NO.:  [id number]          PATIENT TYPE:  OUT  LOCATION:  SLEEP CENTER                 FACILITY:  [facility]  PHYSICIAN:  [name], MD        DATE OF BIRTH:  1933/03/05  DATE OF STUDY:  11/01/2012                           NOCTURNAL POLYSOMNOGRAM  REFERRING PHYSICIAN:  [name], MD  FACILITY:  [facility].  REFERRING PHYSICIAN:  [name], MD  INDICATION:  [name] is a 78 year old female, who was found to have complex sleep apnea on a sleep study from October 03, 2012.  She had an apnea/hypopnea index of 23.4 and oxygen saturation nadir of 70%.  She returned to the sleep lab for a CPAP titration study.  Height is 5 feet, weight is 130 pounds, BMI is 25, neck size 13.5 inches.  MEDICATIONS:  Lisinopril, Synthroid, Lipitor, aspirin, Premarin, and meclizine.  EPWORTH SLEEPINESS SCORE:  11.  SLEEP ARCHITECTURE:  Total recording time was 394 minutes.  Total sleep time was 337 minutes.  Sleep efficiency was 85%.  Sleep latency was 31 minutes.  REM latency was 67 minutes.  The patient was observed in all stages of sleep and she slept predominantly in the nonsupine position.  RESPIRATORY DATA:  The average respiratory rate was 13.  The patient was started on CPAP of 5 and increased to 7 cm water.  With CPAP set at 7 cm water, the apnea/hypopnea index was reduced to 2.2.  At this pressure setting, she was observed in REM sleep but not supine sleep.  CARDIAC DATA:  The average heart rate was 55 and the rhythm strip showed sinus rhythm with occasional PVCs.  OXYGEN DATA:  The baseline oxygenation was 95%.  The oxygen saturation nadir was 87%.  The study was conducted without the use of supplement oxygen.  MOVEMENT/PARASOMNIA:  The periodic limb movement index was 3.  The patient had 1 restroom trip.  IMPRESSION:  This was a successful CPAP titration study.  The patient had good control of her  sleep apnea with CPAP of 7 cm water.  I would recommend the patient be started on CPAP of 7 cm water and monitored for her clinical response.     [name], MD Diplomat, American Board of Sleep Medicine    VS/MEDQ  D:  11/17/2012 11:12:04  T:  11/18/2012 00:56:42  Job:  [id number]

## 2012-11-18 NOTE — Telephone Encounter (Signed)
I have send reminder to myself to call pt for ROV

## 2012-11-21 ENCOUNTER — Encounter (HOSPITAL_BASED_OUTPATIENT_CLINIC_OR_DEPARTMENT_OTHER): Payer: Medicare Other

## 2012-11-29 ENCOUNTER — Telehealth: Payer: Self-pay | Admitting: Pulmonary Disease

## 2012-11-29 NOTE — Telephone Encounter (Signed)
Noted  

## 2012-11-29 NOTE — Telephone Encounter (Signed)
Message copied by [name] on Mon Nov 29, 2012 11:39 AM ------      Message from: Renard Hamper      Created: Mon Nov 29, 2012  9:28 AM      Regarding: CPAP Setup      Contact: [phone]       Hello Dr. Craige Cotta.  Per my supervisor's request, below is the information regarding this patients delay in her CPAP setup.            This pt came over to [place] as a new cpap referral from [place] (Dr Craige Cotta) on 11/20. I called on 11/20 and spoke to pt's husband. He said the pt was on a trip out of state, and would not be returning until Tuesday. I called back yesterday, and was told that the pt would be home today. When I called this morning, the man I spoke with said that the pt's brother had passed away unexpectedly, and that she would be staying in [place]. He was not sure when she is coming home. I gave him my name and phone number, and asked that he have her call me when she comes home, but I am not sure when that will be. Please make the office aware of the situation with this pt, so that they will know why she has not yet been set up. Thank you!            Ginny Forth

## 2013-01-17 ENCOUNTER — Other Ambulatory Visit: Payer: Self-pay | Admitting: Internal Medicine

## 2013-01-31 ENCOUNTER — Other Ambulatory Visit: Payer: Self-pay | Admitting: Internal Medicine

## 2013-02-10 ENCOUNTER — Other Ambulatory Visit: Payer: Self-pay | Admitting: Internal Medicine

## 2013-02-17 ENCOUNTER — Ambulatory Visit: Payer: Medicare Other | Admitting: Internal Medicine

## 2013-02-17 DIAGNOSIS — Z0289 Encounter for other administrative examinations: Secondary | ICD-10-CM

## 2013-02-21 ENCOUNTER — Other Ambulatory Visit: Payer: Self-pay | Admitting: Internal Medicine

## 2013-05-12 ENCOUNTER — Telehealth: Payer: Self-pay | Admitting: Pulmonary Disease

## 2013-05-12 NOTE — Telephone Encounter (Signed)
CPAP 03/16/13 to 04/14/13 >> Used on 13 of 30 nights with average 4 hrs.  Average AHI 12.7 with CPAP 7 cm H2O.  Will have my nurse schedule ROV to review status of sleep apnea.

## 2013-05-16 ENCOUNTER — Telehealth: Payer: Self-pay | Admitting: *Deleted

## 2013-05-16 MED ORDER — LISINOPRIL 40 MG PO TABS: ORAL_TABLET | ORAL | Status: DC

## 2013-05-16 NOTE — Telephone Encounter (Signed)
R'cd fax from [facility] for refill of Lisinopril.

## 2013-05-19 NOTE — Telephone Encounter (Signed)
lmtcb x1 w/ spouse. I was advised pt was out of state and not sure when she will return but will give her the message. Will sign off message

## 2013-06-13 ENCOUNTER — Telehealth: Payer: Self-pay | Admitting: Pulmonary Disease

## 2013-06-13 NOTE — Telephone Encounter (Signed)
I called and made pt aware of recs. She voiced her understanding and will keep appt with VS. Nothing further was needed

## 2013-06-13 NOTE — Telephone Encounter (Signed)
I spoke with Melissa. She stated pt has been non compliant. Per Efraim Kaufmann we were sent 3 downloads. Her best was 43%. Pt best option is to turn in machine and start process over again. Please advise Dr. Craige Cotta thanks

## 2013-06-13 NOTE — Telephone Encounter (Signed)
She was started on CPAP in November 2013, and was supposed to have ROV between 30 and 90 days after set up >> she has not had follow up yet.  She also needs to have > 4 hrs use on > 70% of nights >> her download did not show this kind of use.  Please explain to pt that based on these issues medicare will deny payment for continued use of CPAP.  These are medicare guidelines, and can not be adjusted.  If she wishes to continue with CPAP she will need re-evaluation with face-to-face physician assessment, and likely will need repeat sleep testing.  She is scheduled for ROV 07/04/13 >> can discuss in more detail then.

## 2013-06-13 NOTE — Telephone Encounter (Signed)
I spoke with pt. She stated she received a letter from [facility] stating medicare does not feel like she meets criteria for CPAP and they sent her a form to fill out that she may have to pay for what she has already use the time of CPAP. Pt is asking for our help.  I called Melissa from [facility] and she is looking into this and will call me back. Will await her call back

## 2013-06-14 ENCOUNTER — Telehealth: Payer: Self-pay | Admitting: Pulmonary Disease

## 2013-06-14 NOTE — Telephone Encounter (Signed)
Spoke with [name] with [facility]  She is asking if VS would like to go ahead and order another sleep study before the pt is seen here on 07/04/13 This is for medicare compliance, since she has been non compliant in the past will have to start the process all over again Please advise, thanks!

## 2013-06-15 NOTE — Telephone Encounter (Signed)
i spoke with [facility] and made her aware. She voiced her understanding and needed nothing further

## 2013-06-15 NOTE — Telephone Encounter (Signed)
She needs face-to-face physician evaluation prior to being able to order sleep study.

## 2013-06-18 ENCOUNTER — Other Ambulatory Visit: Payer: Self-pay | Admitting: Internal Medicine

## 2013-07-04 ENCOUNTER — Ambulatory Visit (INDEPENDENT_AMBULATORY_CARE_PROVIDER_SITE_OTHER): Payer: Medicare Other | Admitting: Pulmonary Disease

## 2013-07-04 ENCOUNTER — Encounter: Payer: Self-pay | Admitting: Pulmonary Disease

## 2013-07-04 VITALS — BP 132/82 | HR 70 | Temp 98.0°F | Ht 60.0 in | Wt 131.0 lb

## 2013-07-04 DIAGNOSIS — G4730 Sleep apnea, unspecified: Secondary | ICD-10-CM | POA: Diagnosis not present

## 2013-07-04 DIAGNOSIS — G4731 Primary central sleep apnea: Secondary | ICD-10-CM
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
## 2013-07-04 NOTE — Patient Instructions (Signed)
Will arrange for sleep study Will call to arrange for follow up after sleep study reviewed 

## 2013-07-04 NOTE — Assessment & Plan Note (Signed)
She has hx of complex sleep apnea.  She has history of hypertension.  She did not meed medicare criteria to continue payment for her CPAP set up.  She still has snoring, sleep disruption, and witnessed apnea.  I am concerned she still has sleep disordered breathing.  Will arrange for in lab sleep study to further assess.

## 2013-07-27 ENCOUNTER — Ambulatory Visit (HOSPITAL_BASED_OUTPATIENT_CLINIC_OR_DEPARTMENT_OTHER): Payer: Medicare Other | Attending: Pulmonary Disease | Admitting: Radiology

## 2013-07-27 VITALS — Ht 60.0 in | Wt 130.0 lb

## 2013-07-27 DIAGNOSIS — G4731 Primary central sleep apnea: Secondary | ICD-10-CM

## 2013-07-27 DIAGNOSIS — G4733 Obstructive sleep apnea (adult) (pediatric): Secondary | ICD-10-CM | POA: Diagnosis not present

## 2013-07-27 DIAGNOSIS — I10 Essential (primary) hypertension: Secondary | ICD-10-CM | POA: Insufficient documentation

## 2013-08-04 ENCOUNTER — Encounter: Payer: Self-pay | Admitting: Pulmonary Disease

## 2013-08-04 ENCOUNTER — Telehealth: Payer: Self-pay | Admitting: Pulmonary Disease

## 2013-08-04 DIAGNOSIS — G4733 Obstructive sleep apnea (adult) (pediatric): Secondary | ICD-10-CM

## 2013-08-04 NOTE — Telephone Encounter (Signed)
Split night PSG 07/27/13 >> AHI 23.6, SpO2 low 76%, did best with CPAP 8 to 10 cm H2O.  Will have my nurse inform pt that sleep study shows she has moderate sleep apnea.  I have sent order for her to get restarted on CPAP.  She will need ROV 2 months after CPAP set up.

## 2013-08-04 NOTE — Telephone Encounter (Signed)
lmtcb

## 2013-08-05 NOTE — Telephone Encounter (Signed)
Pt is aware of sleep study results. 

## 2013-08-05 NOTE — Procedures (Signed)
[name], [name]              ACCOUNT NO.:  [id number]  MEDICAL RECORD NO.:  [id number]          PATIENT TYPE:  OUT  LOCATION:  SLEEP CENTER                 FACILITY:  [facility]  PHYSICIAN:  [name], MD        DATE OF BIRTH:  1933-03-10  DATE OF STUDY:  07/27/2013                           NOCTURNAL POLYSOMNOGRAM  REFERRING PHYSICIAN:  [name], MD  INDICATION FOR STUDY:  [name] is a 79 year old female who has a history of hypertension and obstructive sleep apnea.  She had a previous sleep study on October 03, 2012, which showed an apnea/hypopnea index of 23.4 with an oxygen saturation nadir was 17%, and had both central and obstructive events.  She was previously on CPAP therapy.  However, she had difficulty maintaining compliance.  She continues to report snoring, sleep disruption, and daytime sleepiness.  She is referred to the sleep lab for further evaluation of her hypersomnia with obstructive sleep apnea.  Height is 5 feet, weight is 230 pounds, BMI is 25.  Neck size 12.5 inches.  MEDICATIONS:  Aspirin, Lipitor,Rhinocort, vitamin D3, Prinivil, Antivert, Singulair, Premarin, and Synthroid.  EPWORTH SLEEPINESS SCORE:  17.  SLEEP ARCHITECTURE:  The patient followed a split night sleep protocol. During the diagnostic portion of the study, total recording time was 165 minutes.  Total sleep time was 119 minutes.  Sleep efficiency was 72%, sleep latency is 27 minutes, REM latency was 96 minutes.  This portion of study was notable for lack of slow-wave sleep and she slept in both supine and nonsupine positions.  During the titration portion of study, total recording time was 220 minutes.  Total sleep time was 184 minutes.  Sleep efficiency was 83%. Sleep latency was 1 minute.  REM latency is 105 minutes.  She was observed in all stages of sleep and slept exclusively in the supine position.  RESPIRATORY DATA:  The average respiratory rate was 16.  During  the diagnostic portion of the study, loud snoring was noted by the technician.  The overall apnea/hypopnea index was 23.6.  There was 1 central apneic event.  There was 1 mixed respiratory event.  The remainder of the events were obstructive in nature. During the titration portion of the study, the patient was started on CPAP of 5 and increased to 12 cm water.  It appeared that she had optimal control of her sleep-disordered breathing with improved sleep quality with CPAP pressures between 8 and 10 cm water.  OXYGEN DATA:  The baseline oxygenation was 98%.  The oxygen saturation nadir was 76%.  The study was conducted without the use of supplemental oxygen.  CARDIAC DATA:  The average heart rate is 60 and the rhythm strip showed sinus rhythm and occasional PVCs.  MOVEMENT-PARASOMNIA:  The periodic limb movement index was 2, and the patient had 1 restroom trip.  IMPRESSION:  This study shows evidence for moderate obstructive sleep apnea with an apnea/hypopnea index of 23.6, and oxygen saturation nadir of 76%.  She seemed to do well with CPAP with a pressure range between 8 and 10 cm water.  I would recommend, the patient be started on auto CPAP with a pressure range of  5-15 cm of water and monitored for her clinical response.     [name], MD Diplomat, American Board of Sleep Medicine    VS/MEDQ  D:  08/04/2013 12:47:40  T:  08/05/2013 02:37:32  Job:  [id number]

## 2013-08-26 ENCOUNTER — Telehealth: Payer: Self-pay | Admitting: Pulmonary Disease

## 2013-08-26 NOTE — Telephone Encounter (Signed)
Order was sent to [facility] for her restart of CPAP. I called and spoke with pt and advised her of the DME that called her. She will give there office a call back since she did not know we sent order to them. Nothing further needed

## 2013-08-30 ENCOUNTER — Telehealth: Payer: Self-pay | Admitting: Pulmonary Disease
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
## 2013-08-31 NOTE — Telephone Encounter (Signed)
Message sent to [email or web address]  to pick up this order it was sent to [facility] by mistake Tobe Sos

## 2013-09-01 ENCOUNTER — Other Ambulatory Visit: Payer: Self-pay | Admitting: Internal Medicine

## 2013-09-03 ENCOUNTER — Other Ambulatory Visit: Payer: Self-pay | Admitting: Internal Medicine

## 2013-09-09 ENCOUNTER — Other Ambulatory Visit: Payer: Self-pay

## 2013-09-09 MED ORDER — LISINOPRIL 40 MG PO TABS: ORAL_TABLET | ORAL | Status: DC

## 2013-10-03 ENCOUNTER — Other Ambulatory Visit: Payer: Self-pay | Admitting: Internal Medicine

## 2013-10-18 ENCOUNTER — Telehealth: Payer: Self-pay | Admitting: Pulmonary Disease

## 2013-10-18 NOTE — Telephone Encounter (Signed)
Auto CPAP 09/06/13 to 10/05/13 >> Used on 29 of 30 nights with average 4 hrs 38 min.  Average AHI 13.3 (CI 1.2, OI 4.4, unknown 7.6) with median CPAP 8 cm H2O and 95 th percentile CPAP 11 cm H2O.  Will have my nurse schedule ROV to review status of therapy for sleep apnea.

## 2013-10-19 NOTE — Telephone Encounter (Signed)
lmtcb x1 

## 2013-10-24 NOTE — Telephone Encounter (Signed)
Pt is aware that she needs OV. States that she will call us back to schedule that.

## 2013-11-12 ENCOUNTER — Other Ambulatory Visit: Payer: Self-pay | Admitting: Internal Medicine

## 2013-11-25 ENCOUNTER — Telehealth: Payer: Self-pay | Admitting: Pulmonary Disease

## 2013-11-25 NOTE — Telephone Encounter (Signed)
Dr. Craige Cotta had a cancellation for 11/28/13 at 4:30pm. Pt has been placed in that appointment. Nothing further was needed.

## 2013-11-25 NOTE — Telephone Encounter (Signed)
Pt is requesting that she be seen sooner than 12/2013 appt.   Please advise [name] where you are okay double booking pt to come in and discuss CPAP with VS.

## 2013-11-28 ENCOUNTER — Encounter (INDEPENDENT_AMBULATORY_CARE_PROVIDER_SITE_OTHER): Payer: Self-pay

## 2013-11-28 ENCOUNTER — Encounter: Payer: Self-pay | Admitting: Pulmonary Disease

## 2013-11-28 ENCOUNTER — Ambulatory Visit (INDEPENDENT_AMBULATORY_CARE_PROVIDER_SITE_OTHER): Payer: Medicare Other | Admitting: Pulmonary Disease

## 2013-11-28 VITALS — BP 116/80 | HR 62 | Ht 60.0 in | Wt 129.0 lb

## 2013-11-28 DIAGNOSIS — Z23 Encounter for immunization: Secondary | ICD-10-CM

## 2013-11-28 DIAGNOSIS — G4733 Obstructive sleep apnea (adult) (pediatric): Secondary | ICD-10-CM | POA: Diagnosis not present

## 2013-11-28 NOTE — Patient Instructions (Signed)
Follow up in 1 year.
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
## 2013-11-29 NOTE — Assessment & Plan Note (Signed)
Will continue auto CPAP.  She reports compliance with CPAP and improvement with therapy.  Discussed techniques to improve mask fit.  If mouth dryness persists, then may need to consider chin strap or change to different mask type.

## 2013-12-05 ENCOUNTER — Other Ambulatory Visit: Payer: Self-pay | Admitting: Internal Medicine

## 2013-12-05 MED ORDER — LISINOPRIL 40 MG PO TABS: 40.0000 mg | ORAL_TABLET | Freq: Every day | ORAL | Status: DC

## 2013-12-05 NOTE — Telephone Encounter (Signed)
Robin to let pt know;  30 days zestril sent to [facility] on [address], but will need ROV for further refills

## 2013-12-05 NOTE — Telephone Encounter (Signed)
Pt has not been seen within a year. OK to refill? 

## 2013-12-06 NOTE — Telephone Encounter (Signed)
Called the patient informed of MD instructions. 

## 2013-12-17 ENCOUNTER — Other Ambulatory Visit: Payer: Self-pay | Admitting: Internal Medicine

## 2013-12-21 ENCOUNTER — Ambulatory Visit (INDEPENDENT_AMBULATORY_CARE_PROVIDER_SITE_OTHER): Payer: Medicare Other | Admitting: Internal Medicine

## 2013-12-21 ENCOUNTER — Encounter: Payer: Self-pay | Admitting: Internal Medicine

## 2013-12-21 ENCOUNTER — Ambulatory Visit (INDEPENDENT_AMBULATORY_CARE_PROVIDER_SITE_OTHER): Payer: Medicare Other

## 2013-12-21 VITALS — BP 132/92 | HR 67 | Temp 98.1°F | Ht 60.0 in | Wt 128.0 lb

## 2013-12-21 DIAGNOSIS — I10 Essential (primary) hypertension: Secondary | ICD-10-CM

## 2013-12-21 DIAGNOSIS — E785 Hyperlipidemia, unspecified: Secondary | ICD-10-CM | POA: Diagnosis not present

## 2013-12-21 DIAGNOSIS — Z136 Encounter for screening for cardiovascular disorders: Secondary | ICD-10-CM | POA: Diagnosis not present

## 2013-12-21 DIAGNOSIS — E039 Hypothyroidism, unspecified: Secondary | ICD-10-CM | POA: Diagnosis not present

## 2013-12-21 DIAGNOSIS — Z23 Encounter for immunization: Secondary | ICD-10-CM

## 2013-12-21 DIAGNOSIS — Z0000 Encounter for general adult medical examination without abnormal findings: Secondary | ICD-10-CM

## 2013-12-21 LAB — CBC WITH DIFFERENTIAL/PLATELET
Basophils Relative: 0.5 % (ref 0.0–3.0)
Eosinophils Relative: 2.8 % (ref 0.0–5.0)
HCT: 40.4 % (ref 36.0–46.0)
Hemoglobin: 13.4 g/dL (ref 12.0–15.0)
Lymphocytes Relative: 38.6 % (ref 12.0–46.0)
Lymphs Abs: 2.3 10*3/uL (ref 0.7–4.0)
MCV: 92.7 fl (ref 78.0–100.0)
Monocytes Absolute: 0.4 10*3/uL (ref 0.1–1.0)
Monocytes Relative: 7.4 % (ref 3.0–12.0)
Neutro Abs: 3 10*3/uL (ref 1.4–7.7)
Platelets: 214 10*3/uL (ref 150.0–400.0)
RBC: 4.36 Mil/uL (ref 3.87–5.11)
WBC: 5.9 10*3/uL (ref 4.5–10.5)

## 2013-12-21 LAB — LIPID PANEL
Cholesterol: 165 mg/dL (ref 0–200)
HDL: 74.9 mg/dL (ref >39.00)
LDL Cholesterol: 79 mg/dL (ref 0–99)
VLDL: 11.6 mg/dL (ref 0.0–40.0)

## 2013-12-21 LAB — BASIC METABOLIC PANEL
BUN: 12 mg/dL (ref 6–23)
Calcium: 9.1 mg/dL (ref 8.4–10.5)
Chloride: 106 mEq/L (ref 96–112)
GFR: 94.49 mL/min (ref >60.00)
Glucose, Bld: 88 mg/dL (ref 70–99)
Potassium: 4.3 mEq/L (ref 3.5–5.1)

## 2013-12-21 LAB — TSH: TSH: 1.08 u[IU]/mL (ref 0.35–5.50)

## 2013-12-21 LAB — HEPATIC FUNCTION PANEL
AST: 18 U/L (ref 0–37)
Albumin: 3.7 g/dL (ref 3.5–5.2)
Total Bilirubin: 0.8 mg/dL (ref 0.3–1.2)

## 2013-12-21 NOTE — Patient Instructions (Addendum)
You had the new Prevnar pneumonia shot Your EKG was OK today Please continue all other medications as before, and refills have been done if requested. Please have the pharmacy call with any other refills you may need. Please continue your efforts at being more active, low cholesterol diet, and weight control. You are otherwise up to date with prevention measures today. Please keep your appointments with your specialists as you may have planned You will be contacted regarding the referral for: mammogram, and colonoscopy  Please go to the LAB in the Basement (turn left off the elevator) for the tests to be done today  You will be contacted by phone if any changes need to be made immediately.  Otherwise, you will receive a letter about your results with an explanation, but please check with MyChart first.  Please return in 1 year for your yearly visit, or sooner if needed

## 2013-12-21 NOTE — Addendum Note (Signed)
Addended by: Scharlene Gloss B on: 12/21/2013 11:28 AM   Modules accepted: Orders

## 2013-12-21 NOTE — Assessment & Plan Note (Signed)
stable overall by history and exam, recent data reviewed with pt, and pt to continue medical treatment as before,  to f/u any worsening symptoms or concerns Lab Results  Component Value Date   LDLCALC 46 06/09/2012

## 2013-12-21 NOTE — Assessment & Plan Note (Signed)
stable overall by history and exam, recent data reviewed with pt, and pt to continue medical treatment as before,  to f/u any worsening symptoms or concerns Lab Results  Component Value Date   TSH 2.61 06/09/2012

## 2013-12-21 NOTE — Assessment & Plan Note (Signed)

## 2013-12-21 NOTE — Progress Notes (Signed)
Pre-visit discussion using our clinic review tool. No additional management support is needed unless otherwise documented below in the visit note.  

## 2013-12-21 NOTE — Assessment & Plan Note (Signed)
stable overall by history and exam, recent data reviewed with pt, and pt to continue medical treatment as before,  to f/u any worsening symptoms or concerns BP Readings from Last 3 Encounters:  12/21/13 132/92  11/28/13 116/80  07/04/13 132/82
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
## 2013-12-26 ENCOUNTER — Encounter: Payer: Self-pay | Admitting: Internal Medicine

## 2013-12-27 ENCOUNTER — Encounter: Payer: Medicare Other | Admitting: Internal Medicine

## 2013-12-28 ENCOUNTER — Encounter: Payer: Self-pay | Admitting: Internal Medicine

## 2013-12-28 LAB — URINALYSIS, ROUTINE W REFLEX MICROSCOPIC
Ketones, ur: NEGATIVE
Leukocytes, UA: NEGATIVE
Specific Gravity, Urine: 1.01 (ref 1.000–1.030)
Total Protein, Urine: NEGATIVE
Urine Glucose: NEGATIVE
Urobilinogen, UA: 0.2 (ref 0.0–1.0)
pH: 7 (ref 5.0–8.0)

## 2014-01-03 ENCOUNTER — Telehealth: Payer: Self-pay | Admitting: *Deleted

## 2014-01-03 NOTE — Telephone Encounter (Signed)
Patient called returning a call she states she couldn't understand.  Apologizes for calling.

## 2014-01-12 ENCOUNTER — Ambulatory Visit (HOSPITAL_COMMUNITY)
Admission: RE | Admit: 2014-01-12 | Discharge: 2014-01-12 | Disposition: A | Discharge status: Home / Self Care | Payer: Medicare Other | Source: Ambulatory Visit | Attending: Internal Medicine | Admitting: Internal Medicine

## 2014-01-12 DIAGNOSIS — Z0000 Encounter for general adult medical examination without abnormal findings: Secondary | ICD-10-CM

## 2014-01-12 DIAGNOSIS — Z1231 Encounter for screening mammogram for malignant neoplasm of breast: Secondary | ICD-10-CM | POA: Insufficient documentation

## 2014-01-17 ENCOUNTER — Other Ambulatory Visit: Payer: Self-pay | Admitting: Internal Medicine

## 2014-01-17 DIAGNOSIS — R928 Other abnormal and inconclusive findings on diagnostic imaging of breast: Secondary | ICD-10-CM

## 2014-01-26 ENCOUNTER — Other Ambulatory Visit: Payer: Self-pay | Admitting: Internal Medicine

## 2014-02-03 ENCOUNTER — Encounter: Payer: Self-pay | Admitting: Internal Medicine

## 2014-03-03 ENCOUNTER — Other Ambulatory Visit: Payer: Self-pay | Admitting: Internal Medicine

## 2014-03-21 ENCOUNTER — Other Ambulatory Visit: Payer: Medicare Other

## 2014-03-24 ENCOUNTER — Ambulatory Visit
Admission: RE | Admit: 2014-03-24 | Discharge: 2014-03-24 | Disposition: A | Discharge status: Home / Self Care | Payer: Medicare Other | Source: Ambulatory Visit | Attending: Internal Medicine | Admitting: Internal Medicine

## 2014-03-24 DIAGNOSIS — N6009 Solitary cyst of unspecified breast: Secondary | ICD-10-CM | POA: Diagnosis not present

## 2014-03-24 DIAGNOSIS — R928 Other abnormal and inconclusive findings on diagnostic imaging of breast: Secondary | ICD-10-CM

## 2014-04-24 ENCOUNTER — Other Ambulatory Visit: Payer: Self-pay | Admitting: Internal Medicine

## 2014-04-24 ENCOUNTER — Telehealth: Payer: Self-pay

## 2014-04-24 MED ORDER — ATORVASTATIN CALCIUM 40 MG PO TABS: ORAL_TABLET | ORAL | Status: DC

## 2014-04-24 NOTE — Telephone Encounter (Signed)
[facility],,,/lmb

## 2014-04-24 NOTE — Telephone Encounter (Signed)
The patient called and is in of a refill of her Lipitor 40mg    [facility]   Callback - [phone]

## 2014-04-25 ENCOUNTER — Other Ambulatory Visit: Payer: Self-pay

## 2014-04-25 MED ORDER — ATORVASTATIN CALCIUM 40 MG PO TABS: ORAL_TABLET | ORAL | Status: DC

## 2014-05-14 ENCOUNTER — Other Ambulatory Visit: Payer: Self-pay | Admitting: Internal Medicine

## 2014-09-14 ENCOUNTER — Telehealth: Payer: Self-pay | Admitting: Pulmonary Disease

## 2014-09-14 DIAGNOSIS — G4733 Obstructive sleep apnea (adult) (pediatric): Secondary | ICD-10-CM

## 2014-09-14 NOTE — Telephone Encounter (Signed)
Pt last seen 11/2013 told to f/u in 1 year  Called pt to verify what exactly she needs. LMTCB x1

## 2014-09-15 NOTE — Telephone Encounter (Signed)
Called and spoke with pt and she is just needing an order sent to [facility] to get her cpap supplies.  Pt is aware to keep the appt with VS in November for a yearly follow up.  Pt voiced her understanding and nothing further is needed.

## 2014-10-06 ENCOUNTER — Other Ambulatory Visit: Payer: Self-pay | Admitting: Internal Medicine

## 2014-11-01 ENCOUNTER — Ambulatory Visit: Payer: Medicare Other | Admitting: Pulmonary Disease

## 2014-12-12 ENCOUNTER — Other Ambulatory Visit: Payer: Self-pay | Admitting: Internal Medicine

## 2014-12-17 ENCOUNTER — Other Ambulatory Visit: Payer: Self-pay | Admitting: Internal Medicine

## 2014-12-28 DIAGNOSIS — H43393 Other vitreous opacities, bilateral: Secondary | ICD-10-CM | POA: Diagnosis not present

## 2014-12-28 DIAGNOSIS — H2513 Age-related nuclear cataract, bilateral: Secondary | ICD-10-CM | POA: Diagnosis not present

## 2015-01-04 ENCOUNTER — Other Ambulatory Visit: Payer: Self-pay | Admitting: Internal Medicine

## 2015-01-10 ENCOUNTER — Ambulatory Visit (INDEPENDENT_AMBULATORY_CARE_PROVIDER_SITE_OTHER): Payer: Medicare Other | Admitting: Pulmonary Disease

## 2015-01-10 ENCOUNTER — Encounter: Payer: Self-pay | Admitting: Pulmonary Disease

## 2015-01-10 VITALS — BP 132/88 | HR 64 | Ht 60.0 in | Wt 131.2 lb

## 2015-01-10 DIAGNOSIS — G4733 Obstructive sleep apnea (adult) (pediatric): Secondary | ICD-10-CM | POA: Diagnosis not present

## 2015-01-10 NOTE — Patient Instructions (Signed)
Follow up in 1 year.

## 2015-01-10 NOTE — Progress Notes (Signed)
Chief Complaint  Patient presents with  . Follow-up    Wears CPAP nightly. Denies problems with mask or pressure.     History of Present Illness: URIJAH RAYNOR is a 81 y.o. female with obstructive sleep apnea.  She has been doing well with CPAP.  She feels this helps.  She still gets tired in the day, and naps for about 1 to 2 hours >> she does not use CPAP then  Tests: PSG 10/03/12>>AHI 23.4, SpO2 low 72%. Complex sleep apnea. PLMI 1. CPAP 11/01/12>>CPAP 7 cm H2O>>AHI 2.2, +R. CPAP 03/16/13 to 04/14/13 >> Used on 13 of 30 nights with average 4 hrs.  Average AHI 12.7 with CPAP 7 cm H2O. Split night PSG 07/27/13 >> AHI 23.6, SpO2 low 76%, did best with CPAP 8 to 10 cm H2O. Auto CPAP 12/11/14 to 01/09/15 >> used on 25 of 30 nights with average 5 hrs and 54 min.  Average AHI is 6 with median CPAP 9 cm H2O and 95 th percentile CPAP 12 cm H20.  PMHx >> hypothyroidism, HLD, HTN, Anxiety, GERD  PSHx, Medications, Allergies, Fhx, Shx reviewed.  Physical Exam: Blood pressure 132/88, pulse 64, height 5' (1.524 m), weight 131 lb 3.2 oz (59.512 kg), SpO2 94 %. Body mass index is 25.62 kg/(m^2).  General - No distress ENT - No sinus tenderness, no oral exudate, no LAN, MP 3, scalloped tongue border Cardiac - s1s2 regular, no murmur Chest - normal respiratory excursion, good air entry, no wheeze/rales/dullness Back - no focal tenderness Abd - soft, non-tender Ext - no edema Neuro - normal strength Skin - no rashes Psych - normal mood, and behavior  Assessment/Plan:  Obstructive sleep apnea. She is compliant with CPAP and reports benefit from therapy. Plan: - continue auto CPAP - advised her to use CPAP whenever she is asleep, including during daytime naps   Chesley Mires, MD [facility] Pager:  [phone] 01/10/2015, 3:22 PM

## 2015-01-23 ENCOUNTER — Ambulatory Visit: Payer: Medicare Other

## 2015-02-13 ENCOUNTER — Encounter: Payer: Self-pay | Admitting: Internal Medicine

## 2015-02-13 ENCOUNTER — Ambulatory Visit (INDEPENDENT_AMBULATORY_CARE_PROVIDER_SITE_OTHER): Payer: Medicare Other | Admitting: Internal Medicine

## 2015-02-13 ENCOUNTER — Other Ambulatory Visit (INDEPENDENT_AMBULATORY_CARE_PROVIDER_SITE_OTHER): Payer: Medicare Other

## 2015-02-13 VITALS — BP 140/90 | HR 68 | Temp 98.3°F | Ht 59.5 in | Wt 132.0 lb

## 2015-02-13 DIAGNOSIS — Z0000 Encounter for general adult medical examination without abnormal findings: Secondary | ICD-10-CM

## 2015-02-13 DIAGNOSIS — E785 Hyperlipidemia, unspecified: Secondary | ICD-10-CM

## 2015-02-13 DIAGNOSIS — E039 Hypothyroidism, unspecified: Secondary | ICD-10-CM | POA: Diagnosis not present

## 2015-02-13 DIAGNOSIS — Z0189 Encounter for other specified special examinations: Secondary | ICD-10-CM | POA: Diagnosis not present

## 2015-02-13 DIAGNOSIS — M25512 Pain in left shoulder: Secondary | ICD-10-CM

## 2015-02-13 DIAGNOSIS — I10 Essential (primary) hypertension: Secondary | ICD-10-CM | POA: Diagnosis not present

## 2015-02-13 LAB — HEPATIC FUNCTION PANEL
ALBUMIN: 3.6 g/dL (ref 3.5–5.2)
ALT: 17 U/L (ref 0–35)
AST: 21 U/L (ref 0–37)
Alkaline Phosphatase: 52 U/L (ref 39–117)
BILIRUBIN TOTAL: 0.6 mg/dL (ref 0.2–1.2)
Bilirubin, Direct: 0.1 mg/dL (ref 0.0–0.3)
Total Protein: 6.5 g/dL (ref 6.0–8.3)

## 2015-02-13 LAB — URINALYSIS, ROUTINE W REFLEX MICROSCOPIC
Bilirubin Urine: NEGATIVE
KETONES UR: NEGATIVE
LEUKOCYTES UA: NEGATIVE
Nitrite: NEGATIVE
Specific Gravity, Urine: 1.02 (ref 1.000–1.030)
Total Protein, Urine: NEGATIVE
Urine Glucose: NEGATIVE
Urobilinogen, UA: 0.2 (ref 0.0–1.0)
pH: 6 (ref 5.0–8.0)

## 2015-02-13 LAB — CBC WITH DIFFERENTIAL/PLATELET
BASOS ABS: 0 10*3/uL (ref 0.0–0.1)
BASOS PCT: 0.9 % (ref 0.0–3.0)
Eosinophils Absolute: 0.2 10*3/uL (ref 0.0–0.7)
Eosinophils Relative: 3.8 % (ref 0.0–5.0)
HCT: 39.9 % (ref 36.0–46.0)
Hemoglobin: 13.3 g/dL (ref 12.0–15.0)
LYMPHS ABS: 2.5 10*3/uL (ref 0.7–4.0)
LYMPHS PCT: 48.8 % — AB (ref 12.0–46.0)
MCHC: 33.3 g/dL (ref 30.0–36.0)
MCV: 93.7 fl (ref 78.0–100.0)
Monocytes Absolute: 0.4 10*3/uL (ref 0.1–1.0)
Monocytes Relative: 7.9 % (ref 3.0–12.0)
NEUTROS ABS: 2 10*3/uL (ref 1.4–7.7)
Neutrophils Relative %: 38.6 % — ABNORMAL LOW (ref 43.0–77.0)
Platelets: 198 10*3/uL (ref 150.0–400.0)
RBC: 4.26 Mil/uL (ref 3.87–5.11)
RDW: 14 % (ref 11.5–15.5)
WBC: 5.2 10*3/uL (ref 4.0–10.5)

## 2015-02-13 LAB — BASIC METABOLIC PANEL
BUN: 17 mg/dL (ref 6–23)
CHLORIDE: 106 meq/L (ref 96–112)
CO2: 31 mEq/L (ref 19–32)
Calcium: 9.2 mg/dL (ref 8.4–10.5)
Creatinine, Ser: 0.89 mg/dL (ref 0.40–1.20)
GFR: 78.52 mL/min (ref >60.00)
Glucose, Bld: 88 mg/dL (ref 70–99)
Potassium: 4.1 mEq/L (ref 3.5–5.1)
SODIUM: 141 meq/L (ref 135–145)

## 2015-02-13 LAB — TSH: TSH: 1.73 u[IU]/mL (ref 0.35–4.50)

## 2015-02-13 LAB — LIPID PANEL
CHOL/HDL RATIO: 2
Cholesterol: 177 mg/dL (ref 0–200)
HDL: 73.7 mg/dL (ref >39.00)
LDL Cholesterol: 81 mg/dL (ref 0–99)
NONHDL: 103.3
Triglycerides: 113 mg/dL (ref 0.0–149.0)
VLDL: 22.6 mg/dL (ref 0.0–40.0)
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
## 2015-02-13 NOTE — Assessment & Plan Note (Signed)
?   msk strain vs bursitis, mild, tylenol prn for now, but if worsens, to see Dr Smith/[facility]

## 2015-02-13 NOTE — Progress Notes (Signed)
Pre visit review using our clinic review tool, if applicable. No additional management support is needed unless otherwise documented below in the visit note. 

## 2015-02-13 NOTE — Assessment & Plan Note (Signed)
stable overall by history and exam, recent data reviewed with pt, and pt to continue medical treatment as before,  to f/u any worsening symptoms or concerns Lab Results  Component Value Date   LDLCALC 79 12/21/2013   For f/u labs

## 2015-02-13 NOTE — Patient Instructions (Addendum)
Please continue all other medications as before, and refills have been done if requested.  Please have the pharmacy call with any other refills you may need.  Please continue your efforts at being more active, low cholesterol diet, and weight control.  You are otherwise up to date with prevention measures today.  Please keep your appointments with your specialists as you may have planned  You will be contacted regarding the referral for: colonoscopy  Please go to the LAB in the Basement (turn left off the elevator) for the tests to be done today  You will be contacted by phone if any changes need to be made immediately.  Otherwise, you will receive a letter about your results with an explanation, but please check with MyChart first.  Please remember to sign up for MyChart if you have not done so, as this will be important to you in the future with finding out test results, communicating by private email, and scheduling acute appointments online when needed.  Please return in 6 months, or sooner if needed

## 2015-02-13 NOTE — Assessment & Plan Note (Signed)
stable overall by history and exam, recent data reviewed with pt, and pt to continue medical treatment as before,  to f/u any worsening symptoms or concerns Lab Results  Component Value Date   TSH 1.08 12/21/2013

## 2015-02-13 NOTE — Assessment & Plan Note (Addendum)
stable overall by history and exam, recent data reviewed with pt, and pt to continue medical treatment as before,  to f/u any worsening symptoms or concerns BP Readings from Last 3 Encounters:  02/13/15 140/90  01/10/15 132/88  12/21/13 132/92   ECG reviewed as per emr

## 2015-02-16 ENCOUNTER — Encounter: Payer: Self-pay | Admitting: Gastroenterology

## 2015-02-20 ENCOUNTER — Other Ambulatory Visit: Payer: Self-pay | Admitting: Internal Medicine

## 2015-02-26 ENCOUNTER — Other Ambulatory Visit: Payer: Self-pay | Admitting: Internal Medicine

## 2015-03-07 ENCOUNTER — Ambulatory Visit (INDEPENDENT_AMBULATORY_CARE_PROVIDER_SITE_OTHER): Payer: Medicare Other

## 2015-03-07 DIAGNOSIS — Z23 Encounter for immunization: Secondary | ICD-10-CM | POA: Diagnosis not present

## 2015-03-13 ENCOUNTER — Ambulatory Visit (AMBULATORY_SURGERY_CENTER): Payer: Self-pay | Admitting: *Deleted

## 2015-03-13 VITALS — Ht 60.0 in | Wt 132.6 lb

## 2015-03-13 DIAGNOSIS — Z1211 Encounter for screening for malignant neoplasm of colon: Secondary | ICD-10-CM

## 2015-03-13 MED ORDER — NA SULFATE-K SULFATE-MG SULF 17.5-3.13-1.6 GM/177ML PO SOLN: ORAL | Status: DC

## 2015-03-13 NOTE — Progress Notes (Signed)
No allergies to eggs or soy. No problems with anesthesia.  Pt given Emmi instructions for colonoscopy  No oxygen use  No diet drug use  

## 2015-03-27 ENCOUNTER — Encounter: Payer: Self-pay | Admitting: Gastroenterology

## 2015-03-27 ENCOUNTER — Ambulatory Visit (AMBULATORY_SURGERY_CENTER): Payer: Medicare Other | Admitting: Gastroenterology

## 2015-03-27 VITALS — BP 152/88 | HR 58 | Temp 96.3°F | Resp 15 | Ht 60.0 in | Wt 132.0 lb

## 2015-03-27 DIAGNOSIS — I10 Essential (primary) hypertension: Secondary | ICD-10-CM | POA: Diagnosis not present

## 2015-03-27 DIAGNOSIS — E039 Hypothyroidism, unspecified: Secondary | ICD-10-CM | POA: Diagnosis not present

## 2015-03-27 DIAGNOSIS — Z1211 Encounter for screening for malignant neoplasm of colon: Secondary | ICD-10-CM | POA: Diagnosis not present

## 2015-03-27 DIAGNOSIS — G4730 Sleep apnea, unspecified: Secondary | ICD-10-CM | POA: Diagnosis not present

## 2015-03-27 DIAGNOSIS — K5730 Diverticulosis of large intestine without perforation or abscess without bleeding: Secondary | ICD-10-CM

## 2015-03-27 MED ORDER — SODIUM CHLORIDE 0.9 % IV SOLN: 500.0000 mL | INTRAVENOUS | Status: DC

## 2015-03-27 NOTE — Op Note (Signed)
[facility]  [address]. [place], [id number]   COLONOSCOPY PROCEDURE REPORT  PATIENT: [name], [name]  MR#: [id number] BIRTHDATE: [birth year] , 81  yrs. old GENDER: female ENDOSCOPIST: Inda Castle, MD REFERRED KL:KJZPH John, M.D. PROCEDURE DATE:  03/27/2015 PROCEDURE:   Colonoscopy, screening First Screening Colonoscopy - Avg.  risk and is 50 yrs.  old or older - No.  Prior Negative Screening - Now for repeat screening. 10 or more years since last screening  History of Adenoma - Now for follow-up colonoscopy & has been > or = to 3 yrs.  N/A ASA CLASS:   Class II INDICATIONS:Colorectal Neoplasm Risk Assessment for this procedure is average risk. MEDICATIONS: Monitored anesthesia care and Propofol 200 mg IV  DESCRIPTION OF PROCEDURE:   After the risks benefits and alternatives of the procedure were thoroughly explained, informed consent was obtained.  The digital rectal exam revealed no abnormalities of the rectum.   The LB [id number]  endoscope was introduced through the anus and advanced to the cecum, which was identified by both the appendix and ileocecal valve. No adverse events experienced.   The quality of the prep was (Suprep was used) excellent.  The instrument was then slowly withdrawn as the colon was fully examined.      COLON FINDINGS: There was mild diverticulosis noted at the hepatic flexure.   There was moderate diverticulosis noted in the sigmoid colon.   The examination was otherwise normal.  Retroflexed views revealed no abnormalities. The time to cecum = 5.8 Withdrawal time = 7.4   The scope was withdrawn and the procedure completed. COMPLICATIONS: There were no immediate complications.  ENDOSCOPIC IMPRESSION: 1.   Mild diverticulosis was noted at the hepatic flexure 2.   Moderate diverticulosis was noted in the sigmoid colon 3.   The examination was otherwise normal  RECOMMENDATIONS: Given your age, you will not need  another colonoscopy for colon cancer screening or polyp surveillance.  These types of tests usually stop around the age 69.  eSigned:  Inda Castle, MD 03/27/2015 2:16 PM   cc:

## 2015-03-27 NOTE — Progress Notes (Signed)
Procedure ends, to recovery, report toMcCoy, RN. VSS.

## 2015-03-27 NOTE — Patient Instructions (Signed)
Discharge instructions given. Handouts on diverticulosis and a high fiber diet Resume previous medications. YOU HAD AN ENDOSCOPIC PROCEDURE TODAY AT THE [facility]:   Refer to the procedure report that was given to you for any specific questions about what was found during the examination.  If the procedure report does not answer your questions, please call your gastroenterologist to clarify.  If you requested that your care partner not be given the details of your procedure findings, then the procedure report has been included in a sealed envelope for you to review at your convenience later.  YOU SHOULD EXPECT: Some feelings of bloating in the abdomen. Passage of more gas than usual.  Walking can help get rid of the air that was put into your GI tract during the procedure and reduce the bloating. If you had a lower endoscopy (such as a colonoscopy or flexible sigmoidoscopy) you may notice spotting of blood in your stool or on the toilet paper. If you underwent a bowel prep for your procedure, you may not have a normal bowel movement for a few days.  Please Note:  You might notice some irritation and congestion in your nose or some drainage.  This is from the oxygen used during your procedure.  There is no need for concern and it should clear up in a day or so.  SYMPTOMS TO REPORT IMMEDIATELY:   Following lower endoscopy (colonoscopy or flexible sigmoidoscopy):  Excessive amounts of blood in the stool  Significant tenderness or worsening of abdominal pains  Swelling of the abdomen that is new, acute  Fever of 100F or higher   For urgent or emergent issues, a gastroenterologist can be reached at any hour by calling [phone].   DIET: Your first meal following the procedure should be a small meal and then it is ok to progress to your normal diet. Heavy or fried foods are harder to digest and may make you feel nauseous or bloated.  Likewise, meals heavy in dairy and vegetables  can increase bloating.  Drink plenty of fluids but you should avoid alcoholic beverages for 24 hours.  ACTIVITY:  You should plan to take it easy for the rest of today and you should NOT DRIVE or use heavy machinery until tomorrow (because of the sedation medicines used during the test).    FOLLOW UP: Our staff will call the number listed on your records the next business day following your procedure to check on you and address any questions or concerns that you may have regarding the information given to you following your procedure. If we do not reach you, we will leave a message.  However, if you are feeling well and you are not experiencing any problems, there is no need to return our call.  We will assume that you have returned to your regular daily activities without incident.  If any biopsies were taken you will be contacted by phone or by letter within the next 1-3 weeks.  Please call us at [phone] if you have not heard about the biopsies in 3 weeks.    SIGNATURES/CONFIDENTIALITY: You and/or your care partner have signed paperwork which will be entered into your electronic medical record.  These signatures attest to the fact that that the information above on your After Visit Summary has been reviewed and is understood.  Full responsibility of the confidentiality of this discharge information lies with you and/or your care-partner. 

## 2015-03-28 ENCOUNTER — Telehealth: Payer: Self-pay | Admitting: *Deleted

## 2015-03-28 NOTE — Telephone Encounter (Signed)
  Follow up Call-  Call back number 03/27/2015  Post procedure Call Back phone  # [phone]  Permission to leave phone message Yes     Patient questions:  Do you have a fever, pain , or abdominal swelling? No. Pain Score  0 *  Have you tolerated food without any problems? Yes.    Have you been able to return to your normal activities? Yes.    Do you have any questions about your discharge instructions: Diet   No. Medications  No. Follow up visit  No.  Do you have questions or concerns about your Care? No.  Actions: * If pain score is 4 or above: No action needed, pain <4.

## 2015-04-12 DIAGNOSIS — I10 Essential (primary) hypertension: Secondary | ICD-10-CM | POA: Diagnosis not present

## 2015-04-12 DIAGNOSIS — H2511 Age-related nuclear cataract, right eye: Secondary | ICD-10-CM | POA: Diagnosis not present

## 2015-04-12 DIAGNOSIS — H25011 Cortical age-related cataract, right eye: Secondary | ICD-10-CM | POA: Diagnosis not present

## 2015-04-12 DIAGNOSIS — H18411 Arcus senilis, right eye: Secondary | ICD-10-CM | POA: Diagnosis not present

## 2015-05-03 DIAGNOSIS — H2512 Age-related nuclear cataract, left eye: Secondary | ICD-10-CM | POA: Diagnosis not present

## 2015-05-03 DIAGNOSIS — H2181 Floppy iris syndrome: Secondary | ICD-10-CM | POA: Diagnosis not present

## 2015-05-03 DIAGNOSIS — H25812 Combined forms of age-related cataract, left eye: Secondary | ICD-10-CM | POA: Diagnosis not present

## 2015-05-12 ENCOUNTER — Other Ambulatory Visit: Payer: Self-pay | Admitting: Internal Medicine

## 2015-05-15 ENCOUNTER — Other Ambulatory Visit: Payer: Self-pay | Admitting: Internal Medicine

## 2015-05-15 DIAGNOSIS — Z1231 Encounter for screening mammogram for malignant neoplasm of breast: Secondary | ICD-10-CM

## 2015-05-24 ENCOUNTER — Ambulatory Visit (HOSPITAL_COMMUNITY)
Admission: RE | Admit: 2015-05-24 | Discharge: 2015-05-24 | Disposition: A | Discharge status: Home / Self Care | Payer: Medicare Other | Source: Ambulatory Visit | Attending: Internal Medicine | Admitting: Internal Medicine

## 2015-05-24 DIAGNOSIS — Z1231 Encounter for screening mammogram for malignant neoplasm of breast: Secondary | ICD-10-CM

## 2015-06-26 ENCOUNTER — Encounter (INDEPENDENT_AMBULATORY_CARE_PROVIDER_SITE_OTHER): Payer: Self-pay

## 2015-06-26 ENCOUNTER — Ambulatory Visit (INDEPENDENT_AMBULATORY_CARE_PROVIDER_SITE_OTHER): Payer: Medicare Other | Admitting: Internal Medicine

## 2015-06-26 ENCOUNTER — Encounter: Payer: Self-pay | Admitting: Internal Medicine

## 2015-06-26 VITALS — BP 170/98 | HR 56 | Wt 133.0 lb

## 2015-06-26 DIAGNOSIS — I10 Essential (primary) hypertension: Secondary | ICD-10-CM

## 2015-06-26 DIAGNOSIS — R002 Palpitations: Secondary | ICD-10-CM | POA: Insufficient documentation

## 2015-06-26 DIAGNOSIS — R079 Chest pain, unspecified: Secondary | ICD-10-CM

## 2015-06-26 NOTE — Patient Instructions (Signed)
Please continue all other medications as before, and refills have been done if requested.  Please have the pharmacy call with any other refills you may need.  Please continue your efforts at being more active, low cholesterol diet, and weight control.  Please keep your appointments with your specialists as you may have planned  You will be contacted regarding the referral for: echocardiogram, and Holter monitor

## 2015-06-26 NOTE — Assessment & Plan Note (Signed)
Mild elev today , likely sitatuional due to anxiety, stable overall by history and exam, recent data reviewed with pt, and pt to continue medical treatment as before,  to f/u any worsening symptoms or concerns BP Readings from Last 3 Encounters:  06/26/15 170/98  03/27/15 152/88  02/13/15 140/90
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
## 2015-06-26 NOTE — Assessment & Plan Note (Addendum)
Cant ro SVT vs afib vs pvc/pac - ECG reviewed as per emr, last tsh normal, ok to cont current meds, for echo, holter monitor cont asa 81 qd

## 2015-06-26 NOTE — Progress Notes (Signed)
Pre visit review using our clinic review tool, if applicable. No additional management support is needed unless otherwise documented below in the visit note. 

## 2015-06-26 NOTE — Assessment & Plan Note (Signed)
More of fleeting sensation with the palpiatsoins, ECG reviewed as per emr, would hold on further stress test for now

## 2015-07-04 ENCOUNTER — Ambulatory Visit (INDEPENDENT_AMBULATORY_CARE_PROVIDER_SITE_OTHER): Payer: Medicare Other

## 2015-07-04 ENCOUNTER — Ambulatory Visit (HOSPITAL_COMMUNITY): Payer: Medicare Other

## 2015-07-04 DIAGNOSIS — R002 Palpitations: Secondary | ICD-10-CM | POA: Diagnosis not present

## 2015-07-04 DIAGNOSIS — R079 Chest pain, unspecified: Secondary | ICD-10-CM | POA: Diagnosis not present

## 2015-07-09 ENCOUNTER — Other Ambulatory Visit: Payer: Self-pay

## 2015-07-09 ENCOUNTER — Ambulatory Visit (HOSPITAL_COMMUNITY): Payer: Medicare Other | Attending: Internal Medicine

## 2015-07-09 DIAGNOSIS — I358 Other nonrheumatic aortic valve disorders: Secondary | ICD-10-CM | POA: Insufficient documentation

## 2015-07-09 DIAGNOSIS — R002 Palpitations: Secondary | ICD-10-CM

## 2015-07-09 DIAGNOSIS — I071 Rheumatic tricuspid insufficiency: Secondary | ICD-10-CM | POA: Diagnosis not present

## 2015-07-09 DIAGNOSIS — R079 Chest pain, unspecified: Secondary | ICD-10-CM

## 2015-07-09 DIAGNOSIS — I351 Nonrheumatic aortic (valve) insufficiency: Secondary | ICD-10-CM | POA: Diagnosis not present

## 2015-07-11 ENCOUNTER — Telehealth: Payer: Self-pay | Admitting: *Deleted

## 2015-07-11 NOTE — Telephone Encounter (Signed)
Document >','<< Less Detail',event)" href="javascript:;">More Detail >>   FW: Import    Josue Hector, MD    Sent: Wed July 11, 2015 12:02 PM    To: Jennefer Bravo    Cc: Richmond Campbell, LPN        Message     SR occasional PAC;s PVC;s no significant arrhythmia         ----- Message -----     From: Jennefer Bravo     Sent: 07/11/2015  9:13 AM      To: Josue Hector, MD, Biagio Borg, MD    Subject: Import                            The document below was attached by Jennefer Bravo [[id number]] on 07/11/2015 at 9:13 AM to the following: Holter monitor - 48 hour on 07/04/2015.         Media Information   File:         Media Manager Notice:     SR occasional PAC;s PVC;s no significant arrhythmia           ----- Message -----      From: Jennefer Bravo      Sent: 07/11/2015  9:13 AM       To: Josue Hector, MD, Biagio Borg, MD     Subject: Import                              The document below was attached by Jennefer Bravo [[id number]] on 07/11/2015 at 9:13 AM to the following: Holter monitor - 48 hour on 07/04/2015.          Description     48 hour holter monitor- day 1     Patient     Trayce, Caravello     Document Type     EKG     Document on 07/11/2015 9:13 AM by Jennefer Bravo : 48 hour holter monitor- day 1     LM TO CALL BACK .Adonis Housekeeper

## 2015-07-16 NOTE — Telephone Encounter (Signed)
PT  AWARE OF  MONITOR RESULTS ./CY 

## 2015-07-18 ENCOUNTER — Other Ambulatory Visit: Payer: Self-pay | Admitting: Internal Medicine

## 2015-07-18 ENCOUNTER — Encounter: Payer: Self-pay | Admitting: Internal Medicine

## 2015-07-18 DIAGNOSIS — R9431 Abnormal electrocardiogram [ECG] [EKG]: Secondary | ICD-10-CM

## 2015-07-18 DIAGNOSIS — R002 Palpitations: Secondary | ICD-10-CM

## 2015-07-27 ENCOUNTER — Other Ambulatory Visit: Payer: Self-pay

## 2015-07-27 MED ORDER — MONTELUKAST SODIUM 10 MG PO TABS
10.0000 mg | ORAL_TABLET | Freq: Every day | ORAL | Status: DC
Start: 2015-07-27 — End: 2016-05-01

## 2015-07-27 MED ORDER — LEVOTHYROXINE SODIUM 75 MCG PO TABS: 75.0000 ug | ORAL_TABLET | Freq: Every day | ORAL | Status: DC

## 2015-07-27 MED ORDER — BUDESONIDE 32 MCG/ACT NA SUSP: 1.0000 | Freq: Every day | NASAL | Status: DC

## 2015-09-11 ENCOUNTER — Ambulatory Visit: Payer: Medicare Other | Admitting: Cardiology

## 2015-09-11 ENCOUNTER — Ambulatory Visit (INDEPENDENT_AMBULATORY_CARE_PROVIDER_SITE_OTHER): Payer: Medicare Other | Admitting: Internal Medicine

## 2015-09-11 ENCOUNTER — Encounter: Payer: Self-pay | Admitting: Internal Medicine

## 2015-09-11 VITALS — BP 116/78 | HR 58 | Temp 98.2°F | Ht 60.0 in | Wt 132.0 lb

## 2015-09-11 DIAGNOSIS — R413 Other amnesia: Secondary | ICD-10-CM

## 2015-09-11 DIAGNOSIS — F411 Generalized anxiety disorder: Secondary | ICD-10-CM | POA: Diagnosis not present

## 2015-09-11 DIAGNOSIS — I10 Essential (primary) hypertension: Secondary | ICD-10-CM
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
## 2015-09-11 NOTE — Patient Instructions (Signed)
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  Please have the pharmacy call with any other refills you may need.  Please keep your appointments with your specialists as you may have planned

## 2015-09-16 DIAGNOSIS — R413 Other amnesia: Secondary | ICD-10-CM | POA: Insufficient documentation

## 2015-09-16 NOTE — Assessment & Plan Note (Signed)
C/w pseudodementia due to recent stressors, MMSE ok, reassured, no further tx needed

## 2015-09-16 NOTE — Assessment & Plan Note (Signed)
stable overall by history and exam, recent data reviewed with pt, and pt to continue medical treatment as before,  to f/u any worsening symptoms or concerns Lab Results  Component Value Date   WBC 5.2 02/13/2015   HGB 13.3 02/13/2015   HCT 39.9 02/13/2015   PLT 198.0 02/13/2015   GLUCOSE 88 02/13/2015   CHOL 177 02/13/2015   TRIG 113.0 02/13/2015   HDL 73.70 02/13/2015   LDLDIRECT 123.2 12/04/2008   LDLCALC 81 02/13/2015   ALT 17 02/13/2015   AST 21 02/13/2015   NA 141 02/13/2015   K 4.1 02/13/2015   CL 106 02/13/2015   CREATININE 0.89 02/13/2015   BUN 17 02/13/2015   CO2 31 02/13/2015   TSH 1.73 02/13/2015   Declines counseling referral

## 2015-09-16 NOTE — Assessment & Plan Note (Signed)
stable overall by history and exam, recent data reviewed with pt, and pt to continue medical treatment as before,  to f/u any worsening symptoms or concerns BP Readings from Last 3 Encounters:  09/11/15 116/78  06/26/15 170/98  03/27/15 152/88

## 2015-12-26 ENCOUNTER — Ambulatory Visit (INDEPENDENT_AMBULATORY_CARE_PROVIDER_SITE_OTHER): Payer: Medicare Other

## 2015-12-26 DIAGNOSIS — Z23 Encounter for immunization: Secondary | ICD-10-CM

## 2016-01-14 ENCOUNTER — Encounter: Payer: Self-pay | Admitting: Adult Health

## 2016-01-14 ENCOUNTER — Ambulatory Visit (INDEPENDENT_AMBULATORY_CARE_PROVIDER_SITE_OTHER): Payer: Medicare Other | Admitting: Adult Health

## 2016-01-14 VITALS — BP 124/64 | HR 75 | Temp 98.6°F | Ht 60.0 in | Wt 132.0 lb

## 2016-01-14 DIAGNOSIS — G4733 Obstructive sleep apnea (adult) (pediatric): Secondary | ICD-10-CM | POA: Diagnosis not present

## 2016-01-14 NOTE — Addendum Note (Signed)
Addended by: Osa Craver on: 01/14/2016 04:49 PM   Modules accepted: Orders

## 2016-01-14 NOTE — Patient Instructions (Signed)
Continue on C Pap at bedtime. Goal is to wear for at least 4 hours each night. Continue to work on weight loss. Do not drive if sleepy Order sent for evaluation of  Download.  follow up Dr. Halford Chessman  In 1 year and As needed

## 2016-01-14 NOTE — Progress Notes (Signed)
Reviewed and agree with assessment/plan. 

## 2016-01-14 NOTE — Assessment & Plan Note (Signed)
Cont on CPAP  Order to DME to check on download issues   Plan  Continue on C Pap at bedtime. Goal is to wear for at least 4 hours each night. Continue to work on weight loss. Do not drive if sleepy Order sent for evaluation of  Download.  follow up Dr. Halford Chessman  In 1 year and As needed
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
## 2016-02-14 ENCOUNTER — Other Ambulatory Visit: Payer: Self-pay | Admitting: Internal Medicine

## 2016-02-20 ENCOUNTER — Other Ambulatory Visit: Payer: Self-pay | Admitting: Internal Medicine

## 2016-05-01 ENCOUNTER — Ambulatory Visit (INDEPENDENT_AMBULATORY_CARE_PROVIDER_SITE_OTHER): Payer: Medicare Other | Admitting: Internal Medicine

## 2016-05-01 ENCOUNTER — Encounter: Payer: Self-pay | Admitting: Internal Medicine

## 2016-05-01 VITALS — BP 122/78 | HR 70 | Temp 98.1°F | Resp 20 | Wt 126.0 lb

## 2016-05-01 DIAGNOSIS — M5412 Radiculopathy, cervical region: Secondary | ICD-10-CM | POA: Diagnosis not present

## 2016-05-01 DIAGNOSIS — J309 Allergic rhinitis, unspecified: Secondary | ICD-10-CM | POA: Diagnosis not present

## 2016-05-01 DIAGNOSIS — I10 Essential (primary) hypertension: Secondary | ICD-10-CM | POA: Diagnosis not present

## 2016-05-01 MED ORDER — BUDESONIDE 32 MCG/ACT NA SUSP: 1.0000 | Freq: Every day | NASAL | Status: DC

## 2016-05-01 MED ORDER — MONTELUKAST SODIUM 10 MG PO TABS: 10.0000 mg | ORAL_TABLET | Freq: Every day | ORAL | Status: DC
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
## 2016-05-01 NOTE — Progress Notes (Signed)
Pre visit review using our clinic review tool, if applicable. No additional management support is needed unless otherwise documented below in the visit note. 

## 2016-05-01 NOTE — Patient Instructions (Signed)
Please take all new medication as prescribed - the rhinocort  Please continue all other medications as before, and refills have been done if requested.  Please have the pharmacy call with any other refills you may need.  Please keep your appointments with your specialists as you may have planned  You will be contacted regarding the referral for: MRI for the neck, and orthopedic referral

## 2016-05-03 NOTE — Assessment & Plan Note (Signed)
Mild to mod, for restart rhinocort asd  to f/u any worsening symptoms or concerns

## 2016-05-03 NOTE — Assessment & Plan Note (Signed)
stable overall by history and exam, recent data reviewed with pt, and pt to continue medical treatment as before,  to f/u any worsening symptoms or concerns BP Readings from Last 3 Encounters:  05/01/16 122/78  01/14/16 124/64  09/11/15 116/78

## 2016-05-03 NOTE — Assessment & Plan Note (Signed)
Recent onset, with neuro change, for MRI c-spine, and refer orthopedic, pain control,  to f/u any worsening symptoms or concerns

## 2016-05-07 ENCOUNTER — Other Ambulatory Visit: Payer: Self-pay | Admitting: Internal Medicine

## 2016-06-14 DIAGNOSIS — H2513 Age-related nuclear cataract, bilateral: Secondary | ICD-10-CM | POA: Diagnosis not present

## 2016-07-04 ENCOUNTER — Other Ambulatory Visit: Payer: Self-pay | Admitting: Internal Medicine

## 2016-07-15 ENCOUNTER — Encounter: Payer: Self-pay | Admitting: Internal Medicine

## 2016-07-15 ENCOUNTER — Ambulatory Visit (INDEPENDENT_AMBULATORY_CARE_PROVIDER_SITE_OTHER): Payer: Medicare Other | Admitting: Internal Medicine

## 2016-07-15 VITALS — BP 130/70 | HR 87 | Temp 98.5°F | Resp 20 | Wt 128.0 lb

## 2016-07-15 DIAGNOSIS — R42 Dizziness and giddiness: Secondary | ICD-10-CM | POA: Diagnosis not present

## 2016-07-15 DIAGNOSIS — L989 Disorder of the skin and subcutaneous tissue, unspecified: Secondary | ICD-10-CM

## 2016-07-15 DIAGNOSIS — I10 Essential (primary) hypertension: Secondary | ICD-10-CM

## 2016-07-15 MED ORDER — MECLIZINE HCL 25 MG PO TABS: ORAL_TABLET | ORAL | Status: DC

## 2016-07-15 MED ORDER — BUDESONIDE 32 MCG/ACT NA SUSP: 1.0000 | Freq: Every day | NASAL | Status: DC

## 2016-07-15 MED ORDER — DIAZEPAM 5 MG PO TABS: 5.0000 mg | ORAL_TABLET | Freq: Two times a day (BID) | ORAL | Status: DC | PRN
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
## 2016-07-15 NOTE — Progress Notes (Signed)
Pre visit review using our clinic review tool, if applicable. No additional management support is needed unless otherwise documented below in the visit note. 

## 2016-07-15 NOTE — Patient Instructions (Signed)
Please take all new medication as prescribed - the valium if the symptoms re-occur  Please continue all other medications as before, and refills have been done if requested - the meclizine (done hardcopy), and the rhinocort to the [place]  Please have the pharmacy call with any other refills you may need.  Please keep your appointments with your specialists as you may have planned  You will be contacted regarding the referral for: Dermatology, and Vestibular rehab for the dizziness, and the Head MRI  You will be contacted by phone if any changes need to be made immediately.  Otherwise, you will receive a letter about your results with an explanation, but please check with MyChart first.  Please remember to sign up for MyChart if you have not done so, as this will be important to you in the future with finding out test results, communicating by private email, and scheduling acute appointments online when needed.

## 2016-07-15 NOTE — Assessment & Plan Note (Signed)
?   Central vs peripheral, though is positional, for Head MRI, cont meclizine prn, also for valium prn, and Vestib rehab

## 2016-07-18 ENCOUNTER — Telehealth: Payer: Self-pay | Admitting: Emergency Medicine

## 2016-07-18 MED ORDER — BUDESONIDE 32 MCG/ACT NA SUSP: 1.0000 | Freq: Every day | NASAL | Status: DC

## 2016-07-18 NOTE — Telephone Encounter (Signed)
Pharmacy needs correct dosage for the prescription budesonide (RHINOCORT AQUA) 32 MCG/ACT nasal spray. She has 2 different directions. Please follow up thanks.

## 2016-07-18 NOTE — Telephone Encounter (Signed)
Received fax needing clarification on directions for Budesonide nasal spray. Updated med sent new script...Johny Chess

## 2016-07-20 NOTE — Assessment & Plan Note (Signed)
stable overall by history and exam, recent data reviewed with pt, and pt to continue medical treatment as before,  to f/u any worsening symptoms or concerns BP Readings from Last 3 Encounters:  07/15/16 130/70  05/01/16 122/78  01/14/16 124/64

## 2016-07-20 NOTE — Assessment & Plan Note (Signed)
Enlarging, cant r/o malignancy, for derm referral

## 2016-07-25 ENCOUNTER — Ambulatory Visit
Admission: RE | Admit: 2016-07-25 | Discharge: 2016-07-25 | Disposition: A | Discharge status: Home / Self Care | Payer: Medicare Other | Source: Ambulatory Visit | Attending: Internal Medicine | Admitting: Internal Medicine

## 2016-07-25 DIAGNOSIS — R55 Syncope and collapse: Secondary | ICD-10-CM | POA: Diagnosis not present

## 2016-07-25 DIAGNOSIS — R42 Dizziness and giddiness: Secondary | ICD-10-CM

## 2016-07-28 ENCOUNTER — Encounter: Payer: Self-pay | Admitting: Internal Medicine

## 2016-08-05 DIAGNOSIS — L821 Other seborrheic keratosis: Secondary | ICD-10-CM | POA: Diagnosis not present

## 2016-08-05 DIAGNOSIS — L820 Inflamed seborrheic keratosis: Secondary | ICD-10-CM | POA: Diagnosis not present

## 2016-11-10 ENCOUNTER — Other Ambulatory Visit: Payer: Self-pay | Admitting: Internal Medicine

## 2016-11-10 DIAGNOSIS — Z1231 Encounter for screening mammogram for malignant neoplasm of breast: Secondary | ICD-10-CM

## 2016-11-16 ENCOUNTER — Other Ambulatory Visit: Payer: Self-pay | Admitting: Internal Medicine

## 2016-12-11 ENCOUNTER — Telehealth: Payer: Self-pay | Admitting: Internal Medicine

## 2016-12-11 DIAGNOSIS — Z01419 Encounter for gynecological examination (general) (routine) without abnormal findings: Secondary | ICD-10-CM

## 2016-12-11 NOTE — Telephone Encounter (Signed)
Patient is requesting a referral to see a GYN. She has never had one and would like to see one. She would also like it to be a female. I informed her that would be up to the office who gets the referral. Please follow up with patient. Thank you.

## 2016-12-11 NOTE — Telephone Encounter (Signed)
Referral done

## 2016-12-15 ENCOUNTER — Ambulatory Visit (INDEPENDENT_AMBULATORY_CARE_PROVIDER_SITE_OTHER): Payer: Medicare Other | Admitting: Internal Medicine

## 2016-12-15 ENCOUNTER — Other Ambulatory Visit: Payer: Medicare Other

## 2016-12-15 ENCOUNTER — Encounter: Payer: Self-pay | Admitting: Internal Medicine

## 2016-12-15 ENCOUNTER — Ambulatory Visit (INDEPENDENT_AMBULATORY_CARE_PROVIDER_SITE_OTHER)
Admission: RE | Admit: 2016-12-15 | Discharge: 2016-12-15 | Disposition: A | Discharge status: Home / Self Care | Payer: Medicare Other | Source: Ambulatory Visit | Attending: Internal Medicine | Admitting: Internal Medicine

## 2016-12-15 VITALS — BP 148/98 | HR 67 | Temp 98.3°F | Ht 60.0 in | Wt 129.0 lb

## 2016-12-15 DIAGNOSIS — S0993XA Unspecified injury of face, initial encounter: Secondary | ICD-10-CM

## 2016-12-15 DIAGNOSIS — N3000 Acute cystitis without hematuria: Secondary | ICD-10-CM

## 2016-12-15 LAB — POC URINALSYSI DIPSTICK (AUTOMATED)
Bilirubin, UA: NEGATIVE
Blood, UA: POSITIVE
GLUCOSE UA: NEGATIVE
KETONES UA: NEGATIVE
Nitrite, UA: NEGATIVE
PROTEIN UA: POSITIVE
Spec Grav, UA: 1.025
UROBILINOGEN UA: 0.2
pH, UA: 6

## 2016-12-15 MED ORDER — SULFAMETHOXAZOLE-TRIMETHOPRIM 800-160 MG PO TABS: 1.0000 | ORAL_TABLET | Freq: Two times a day (BID) | ORAL | 0 refills | Status: DC

## 2016-12-15 NOTE — Progress Notes (Signed)
Subjective:  Patient ID: [name], female    DOB: 1933/02/08  Age: 82 y.o. MRN: [id number]  CC: Fall (fell on 3 days in bothroom, bruises on left cheek. ) and Urinary Tract Infection   HPI [name] presents for concerns about a bruise on the left side of her face. She was in her bathroom about 3 days ago when she tripped and hit her left malar surface on a towel bar. She has a bruise there but no significant pain or swelling. She did not lose consciousness and denies headache, nosebleeds, tooth pain, neck ache, nausea, vomiting, or paresthesias.  She also complains of a one-week history of dysuria, urinary urgency, and urinary frequency.  Outpatient Medications Prior to Visit  Medication Sig Dispense Refill  . aspirin (ASPIRIN LOW DOSE) 81 MG tablet Take 81 mg by mouth daily.      Marland Kitchen atorvastatin (LIPITOR) 40 MG tablet TAKE 1 TABLET DAILY 90 tablet 2  . AZO-CRANBERRY PO Take by mouth. Use as directed     . budesonide (RHINOCORT AQUA) 32 MCG/ACT nasal spray Place 1 spray into both nostrils daily. 25.8 g 1  . Cholecalciferol (VITAMIN D3) 2000 UNITS capsule Take 1,000 Units by mouth daily.     . diazepam (VALIUM) 5 MG tablet Take 1 tablet (5 mg total) by mouth every 12 (twelve) hours as needed for anxiety. 40 tablet 1  . levothyroxine (SYNTHROID, LEVOTHROID) 75 MCG tablet TAKE 1 TABLET DAILY 90 tablet 2  . lisinopril (PRINIVIL,ZESTRIL) 40 MG tablet TAKE 1 TABLET (40 MG TOTAL) DAILY 90 tablet 2  . meclizine (ANTIVERT) 25 MG tablet TAKE 1/2 TO 1 TABLET BY MOUTH EVERY 6 HOURS AS NEEDED 60 tablet 2  . montelukast (SINGULAIR) 10 MG tablet Take 1 tablet (10 mg total) by mouth daily. 90 tablet 3  . Multiple Vitamins-Minerals (WOMENS 50+ MULTI VITAMIN/MIN PO) Take by mouth.    . Omega-3 Fatty Acids (OMEGA 3 PO) Take by mouth.    Marland Kitchen PREMARIN 0.3 MG tablet TAKE 1 TABLET DAILY 90 tablet 2   No facility-administered medications prior to visit.     ROS Review of Systems    Constitutional: Negative for activity change.  HENT: Negative.  Negative for facial swelling, nosebleeds, sinus pain, sinus pressure and trouble swallowing.   Eyes: Negative for redness and visual disturbance.  Respiratory: Negative for cough, chest tightness, shortness of breath and stridor.   Cardiovascular: Negative for chest pain, palpitations and leg swelling.  Gastrointestinal: Negative for abdominal pain, constipation, diarrhea, nausea and vomiting.  Genitourinary: Positive for dysuria, frequency and urgency. Negative for decreased urine volume, difficulty urinating, flank pain and hematuria.  Musculoskeletal: Negative for back pain, gait problem and neck pain.  Neurological: Negative.  Negative for dizziness, syncope, weakness and headaches.  Hematological: Negative.  Negative for adenopathy. Does not bruise/bleed easily.  Psychiatric/Behavioral: Negative.     Objective:  BP (!) 148/98   Pulse 67   Temp 98.3 F (36.8 C)   Ht 5' (1.524 m)   Wt 129 lb (58.5 kg)   SpO2 97%   BMI 25.19 kg/m   BP Readings from Last 3 Encounters:  12/15/16 (!) 148/98  07/15/16 130/70  05/01/16 122/78    Wt Readings from Last 3 Encounters:  12/15/16 129 lb (58.5 kg)  07/15/16 128 lb (58.1 kg)  05/01/16 126 lb (57.2 kg)    Physical Exam  Constitutional: She is oriented to person, place, and time. No distress.  HENT:  Head:  Head is without raccoon's eyes and without Battle's sign.    Right Ear: No hemotympanum.  Left Ear: No hemotympanum.  Nose: No mucosal edema, sinus tenderness or nasal deformity. Right sinus exhibits no maxillary sinus tenderness and no frontal sinus tenderness. Left sinus exhibits no maxillary sinus tenderness and no frontal sinus tenderness.  Mouth/Throat: Oropharynx is clear and moist. No oropharyngeal exudate.  Eyes: Conjunctivae are normal. Right eye exhibits no discharge. Left eye exhibits no discharge. No scleral icterus.  Neck: Normal range of motion. No JVD  present. No tracheal deviation present. No thyromegaly present.  Cardiovascular: Normal rate, regular rhythm, normal heart sounds and intact distal pulses.  Exam reveals no gallop and no friction rub.   No murmur heard. Pulmonary/Chest: Effort normal and breath sounds normal. No stridor. No respiratory distress. She has no wheezes. She has no rales. She exhibits no tenderness.  Abdominal: Soft. Bowel sounds are normal. She exhibits no distension and no mass. There is no tenderness. There is no rebound and no guarding.  Musculoskeletal: She exhibits no edema or deformity.       Cervical back: Normal. She exhibits normal range of motion, no tenderness and no swelling.       Lumbar back: Normal. She exhibits normal range of motion, no tenderness and no bony tenderness.  Lymphadenopathy:    She has no cervical adenopathy.  Neurological: She is oriented to person, place, and time.  Skin: Skin is warm and dry. No rash noted. She is not diaphoretic. No erythema. No pallor.  Vitals reviewed.   Lab Results  Component Value Date   WBC 5.2 02/13/2015   HGB 13.3 02/13/2015   HCT 39.9 02/13/2015   PLT 198.0 02/13/2015   GLUCOSE 88 02/13/2015   CHOL 177 02/13/2015   TRIG 113.0 02/13/2015   HDL 73.70 02/13/2015   LDLDIRECT 123.2 12/04/2008   LDLCALC 81 02/13/2015   ALT 17 02/13/2015   AST 21 02/13/2015   NA 141 02/13/2015   K 4.1 02/13/2015   CL 106 02/13/2015   CREATININE 0.89 02/13/2015   BUN 17 02/13/2015   CO2 31 02/13/2015   TSH 1.73 02/13/2015    Mr Brain Wo Contrast  Result Date: 07/25/2016 CLINICAL DATA:  Episode of debilitating vertigo 1 week ago. EXAM: MRI HEAD WITHOUT CONTRAST TECHNIQUE: Multiplanar, multiecho pulse sequences of the brain and surrounding structures were obtained without intravenous contrast. COMPARISON:  None. FINDINGS: There is no evidence of acute infarct, acute intracranial hemorrhage, mass, midline shift, or extra-axial fluid collection. There is moderate  cerebral atrophy. Patchy T2 hyperintensities throughout the subcortical and deep cerebral white matter bilaterally are nonspecific but compatible with moderately severe chronic small vessel ischemic disease. A chronic microhemorrhage is noted at the lateral margin of the left thalamus/ posterior limb internal capsule. T2 hyperintensities in the deep gray nuclei bilaterally may reflect a combination of dilated perivascular spaces and chronic small vessel ischemia/lacunar infarcts. The cerebellum is unremarkable. Prior left cataract extraction is noted. Limited assessment of the seventh and eighth cranial nerve complexes on this nondedicated study is unremarkable. A moderate-sized right maxillary sinus mucous retention cyst is present. The mastoid air cells are clear. Major intracranial vascular flow voids are preserved. A moderate-sized right atlanto-occipital joint effusion is noted. Bone marrow signal is unremarkable. IMPRESSION: 1. No acute intracranial abnormality. 2. Moderately severe chronic small vessel ischemic disease. Electronically Signed   By: Logan Bores M.D.   On: 07/25/2016 15:53   Assessment & Plan:   Tiffny was  seen today for fall.  Diagnoses and all orders for this visit:  Facial injury, initial encounter- she has a contusion, reassurance offered -     DG Facial Bones Complete; Future  Acute cystitis without hematuria- her ur clx is + for E coli, will treat with bactrim pending sensitivity results   I am having Ms. Blaisdell start on sulfamethoxazole-trimethoprim. I am also having her maintain her aspirin, Vitamin D3, AZO-CRANBERRY PO, Multiple Vitamins-Minerals (WOMENS 50+ MULTI VITAMIN/MIN PO), Omega-3 Fatty Acids (OMEGA 3 PO), montelukast, atorvastatin, levothyroxine, meclizine, diazepam, budesonide, lisinopril, and PREMARIN.  Meds ordered this encounter  Medications  . sulfamethoxazole-trimethoprim (BACTRIM DS,SEPTRA DS) 800-160 MG tablet    Sig: Take 1 tablet by mouth 2 (two)  times daily.    Dispense:  10 tablet    Refill:  0     Follow-up: Return if symptoms worsen or fail to improve.  Scarlette Calico, MD

## 2016-12-15 NOTE — Patient Instructions (Signed)
Facial or Scalp Contusion A facial or scalp contusion is a deep bruise on the face or head. Injuries to the face and head generally cause a lot of swelling, especially around the eyes. Contusions are the result of an injury that caused bleeding under the skin. The contusion may turn blue, purple, or yellow. Minor injuries will give you a painless contusion, but more severe contusions may stay painful and swollen for a few weeks.  CAUSES  A facial or scalp contusion is caused by a blunt injury or trauma to the face or head area.  SIGNS AND SYMPTOMS   Swelling of the injured area.   Discoloration of the injured area.   Tenderness, soreness, or pain in the injured area.  DIAGNOSIS  The diagnosis can be made by taking a medical history and doing a physical exam. An X-ray exam, CT scan, or MRI may be needed to determine if there are any associated injuries, such as broken bones (fractures). TREATMENT  Often, the best treatment for a facial or scalp contusion is applying cold compresses to the injured area. Over-the-counter medicines may also be recommended for pain control.  HOME CARE INSTRUCTIONS   Only take over-the-counter or prescription medicines as directed by your health care provider.   Apply ice to the injured area.   Put ice in a plastic bag.   Place a towel between your skin and the bag.   Leave the ice on for 20 minutes, 2-3 times a day.  SEEK MEDICAL CARE IF:  You have bite problems.   You have pain with chewing.   You are concerned about facial defects. SEEK IMMEDIATE MEDICAL CARE IF:  You have severe pain or a headache that is not relieved by medicine.   You have unusual sleepiness, confusion, or personality changes.   You throw up (vomit).   You have a persistent nosebleed.   You have double vision or blurred vision.   You have fluid drainage from your nose or ear.   You have difficulty walking or using your arms or legs.  MAKE SURE YOU:    Understand these instructions.  Will watch your condition.  Will get help right away if you are not doing well or get worse. This information is not intended to replace advice given to you by your health care provider. Make sure you discuss any questions you have with your health care provider. Document Released: 01/22/2005 Document Revised: 04/07/2016 Document Reviewed: 07/28/2013 Elsevier Interactive Patient Education  2017 [employer].

## 2016-12-15 NOTE — Progress Notes (Signed)
Pre visit review using our clinic review tool, if applicable. No additional management support is needed unless otherwise documented below in the visit note. 

## 2016-12-16 ENCOUNTER — Ambulatory Visit
Admission: RE | Admit: 2016-12-16 | Discharge: 2016-12-16 | Disposition: A | Discharge status: Home / Self Care | Payer: Medicare Other | Source: Ambulatory Visit | Attending: Internal Medicine | Admitting: Internal Medicine

## 2016-12-16 DIAGNOSIS — Z1231 Encounter for screening mammogram for malignant neoplasm of breast: Secondary | ICD-10-CM

## 2016-12-18 ENCOUNTER — Other Ambulatory Visit: Payer: Self-pay | Admitting: Internal Medicine

## 2016-12-18 ENCOUNTER — Telehealth: Payer: Self-pay

## 2016-12-18 DIAGNOSIS — N3000 Acute cystitis without hematuria: Secondary | ICD-10-CM

## 2016-12-18 LAB — CULTURE, URINE COMPREHENSIVE

## 2016-12-18 MED ORDER — NITROFURANTOIN MONOHYD MACRO 100 MG PO CAPS: 100.0000 mg | ORAL_CAPSULE | Freq: Two times a day (BID) | ORAL | 0 refills | Status: AC

## 2016-12-18 MED ORDER — NITROFURANTOIN MONOHYD MACRO 100 MG PO CAPS: 100.0000 mg | ORAL_CAPSULE | Freq: Two times a day (BID) | ORAL | 0 refills | Status: DC

## 2016-12-18 NOTE — Telephone Encounter (Signed)
Called pt and she is in [place]. New ABX sent to pharmacy in [place].

## 2017-01-05 ENCOUNTER — Telehealth: Payer: Self-pay | Admitting: Internal Medicine

## 2017-01-05 NOTE — Telephone Encounter (Signed)
Called patient to schedule awv. Left msg for patient to call office to schedule appt.  °

## 2017-02-01 ENCOUNTER — Other Ambulatory Visit: Payer: Self-pay | Admitting: Internal Medicine

## 2017-02-10 ENCOUNTER — Encounter: Payer: Self-pay | Admitting: Internal Medicine

## 2017-02-18 ENCOUNTER — Ambulatory Visit: Payer: Medicare Other | Admitting: Internal Medicine

## 2017-02-18 ENCOUNTER — Encounter (HOSPITAL_COMMUNITY): Payer: Self-pay | Admitting: Emergency Medicine

## 2017-02-18 ENCOUNTER — Emergency Department (HOSPITAL_COMMUNITY): Payer: Medicare Other

## 2017-02-18 ENCOUNTER — Emergency Department (HOSPITAL_COMMUNITY)
Admission: EM | Admit: 2017-02-18 | Discharge: 2017-02-18 | Disposition: A | Discharge status: Home / Self Care | Payer: Medicare Other | Attending: Emergency Medicine | Admitting: Emergency Medicine

## 2017-02-18 DIAGNOSIS — Y999 Unspecified external cause status: Secondary | ICD-10-CM | POA: Diagnosis not present

## 2017-02-18 DIAGNOSIS — Y939 Activity, unspecified: Secondary | ICD-10-CM | POA: Insufficient documentation

## 2017-02-18 DIAGNOSIS — M25571 Pain in right ankle and joints of right foot: Secondary | ICD-10-CM | POA: Diagnosis not present

## 2017-02-18 DIAGNOSIS — E039 Hypothyroidism, unspecified: Secondary | ICD-10-CM | POA: Insufficient documentation

## 2017-02-18 DIAGNOSIS — Z87891 Personal history of nicotine dependence: Secondary | ICD-10-CM | POA: Insufficient documentation

## 2017-02-18 DIAGNOSIS — X500XXA Overexertion from strenuous movement or load, initial encounter: Secondary | ICD-10-CM | POA: Insufficient documentation

## 2017-02-18 DIAGNOSIS — Z7982 Long term (current) use of aspirin: Secondary | ICD-10-CM | POA: Insufficient documentation

## 2017-02-18 DIAGNOSIS — Y929 Unspecified place or not applicable: Secondary | ICD-10-CM | POA: Insufficient documentation

## 2017-02-18 DIAGNOSIS — I10 Essential (primary) hypertension: Secondary | ICD-10-CM | POA: Insufficient documentation

## 2017-02-18 DIAGNOSIS — M25572 Pain in left ankle and joints of left foot: Secondary | ICD-10-CM

## 2017-02-18 MED ORDER — TRAMADOL HCL 50 MG PO TABS: 50.0000 mg | ORAL_TABLET | Freq: Four times a day (QID) | ORAL | 0 refills | Status: DC | PRN
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
## 2017-02-18 NOTE — ED Triage Notes (Signed)
Patient states she is having right ankle pain x1 week.  She was pushing furniture and she felt something.  Denies any popping or swelling. It;s painful when walking.

## 2017-02-18 NOTE — ED Notes (Signed)
[facility] notified of order for crutches and ASO

## 2017-03-09 ENCOUNTER — Ambulatory Visit: Payer: Medicare Other | Admitting: Pulmonary Disease

## 2017-03-10 ENCOUNTER — Ambulatory Visit (INDEPENDENT_AMBULATORY_CARE_PROVIDER_SITE_OTHER): Payer: Medicare Other | Admitting: Pulmonary Disease

## 2017-03-10 ENCOUNTER — Encounter: Payer: Self-pay | Admitting: Pulmonary Disease

## 2017-03-10 VITALS — BP 130/80 | HR 63 | Ht 60.0 in | Wt 129.0 lb

## 2017-03-10 DIAGNOSIS — J301 Allergic rhinitis due to pollen: Secondary | ICD-10-CM | POA: Diagnosis not present

## 2017-03-10 DIAGNOSIS — G4733 Obstructive sleep apnea (adult) (pediatric): Secondary | ICD-10-CM | POA: Diagnosis not present

## 2017-03-10 DIAGNOSIS — Z9989 Dependence on other enabling machines and devices: Secondary | ICD-10-CM | POA: Diagnosis not present

## 2017-03-10 NOTE — Progress Notes (Signed)
Current Outpatient Prescriptions on File Prior to Visit  Medication Sig  . aspirin (ASPIRIN LOW DOSE) 81 MG tablet Take 81 mg by mouth daily.    Marland Kitchen atorvastatin (LIPITOR) 40 MG tablet TAKE 1 TABLET DAILY  . AZO-CRANBERRY PO Take by mouth. Use as directed   . budesonide (RHINOCORT AQUA) 32 MCG/ACT nasal spray Place 1 spray into both nostrils daily.  . Cholecalciferol (VITAMIN D3) 2000 UNITS capsule Take 1,000 Units by mouth daily.   Marland Kitchen levothyroxine (SYNTHROID, LEVOTHROID) 75 MCG tablet TAKE 1 TABLET DAILY  . lisinopril (PRINIVIL,ZESTRIL) 40 MG tablet TAKE 1 TABLET (40 MG TOTAL) DAILY  . meclizine (ANTIVERT) 25 MG tablet TAKE 1/2 TO 1 TABLET BY MOUTH EVERY 6 HOURS AS NEEDED  . montelukast (SINGULAIR) 10 MG tablet Take 1 tablet (10 mg total) by mouth daily.  . Multiple Vitamins-Minerals (WOMENS 50+ MULTI VITAMIN/MIN PO) Take by mouth.  . Omega-3 Fatty Acids (OMEGA 3 PO) Take by mouth.  Marland Kitchen PREMARIN 0.3 MG tablet TAKE 1 TABLET DAILY  . diazepam (VALIUM) 5 MG tablet Take 1 tablet (5 mg total) by mouth every 12 (twelve) hours as needed for anxiety. (Patient not taking: Reported on 03/10/2017)  . traMADol (ULTRAM) 50 MG tablet Take 1 tablet (50 mg total) by mouth every 6 (six) hours as needed. (Patient not taking: Reported on 03/10/2017)   No current facility-administered medications on file prior to visit.     Chief Complaint  Patient presents with  . Follow-up    Pt states her machine isn't using the water like it normally would. Pt states she has not used her machine in a month. Pt sates she needs new tubing and mask    Sleep tests PSG 10/03/12 >> AHI 23.4, SpO2 low 72%. Complex sleep apnea. PLMI 1. CPAP 11/01/12 >> CPAP 7 cm H2O>>AHI 2.2, +R Auto CPAP 11/27/16 to 02/24/17 >> used on 43 of 90 nights with average 4 hrs 53 min.  Average AHI 9 with median CPAP 10 and 95 th percentile CPAP 13 cm H2O  Past medical history hypothyroidism, HLD, HTN, Anxiety, GERD  Past surgical history, Family  history, Social history, Allergies reviewed  Vital signs BP 130/80 (BP Location: Right Arm, Patient Position: Sitting, Cuff Size: Normal)   Pulse 63   Ht 5' (1.524 m)   Wt 129 lb (58.5 kg)   SpO2 96%   BMI 25.19 kg/m    History of Present Illness: [name] is a 83 y.o. female with obstructive sleep apnea.  I last saw her in 2016.  She has been using CPAP until about 1.5 months ago.  Her machine doesn't seem to be working, and humidifier doesn't seem to run.  She also gets marks on her face from mask, and hasn't gotten a new mask for a while.  She has allergies, and has noticed congestion/discomfort in her ears.    Physical Exam:  General - pleasant Eyes - pupils reactive ENT - TM clear b/l, no sinus tenderness, no oral exudate, no LAN, scalloped tongue Cardiac - regular, no murmur Chest - no wheeze/rales Back - no tenderness Abd - soft, non tender Ext - no edema Neuro - normal strength Skin - no rashes Psych - normal mood  Assessment/Plan:  Obstructive sleep apnea. - her machine does not seem to be working and is more than 81 years old - will arrange for new Resmed mini autoset - will get her mask refitted  Allergic rhinitis. - she can try nasal irrigation -  if this doesn't work, then try flonase  Patient Instructions  Will arrange for new mini CPAP machine  Can try nasal saline rinse for allergies and sinus/ear congestion.  If symptoms persist, then could try flonase 1 spray each nostril daily  Follow up in 3 months after getting new CPAP machine  Time spent 32 minutes  Chesley Mires, MD [facility] Pager:  [phone] 03/10/2017, 10:43 AM

## 2017-03-10 NOTE — Patient Instructions (Signed)
Will arrange for new mini CPAP machine  Can try nasal saline rinse for allergies and sinus/ear congestion.  If symptoms persist, then could try flonase 1 spray each nostril daily  Follow up in 3 months after getting new CPAP machine

## 2017-03-13 ENCOUNTER — Telehealth: Payer: Self-pay | Admitting: Pulmonary Disease

## 2017-03-13 DIAGNOSIS — G4733 Obstructive sleep apnea (adult) (pediatric): Secondary | ICD-10-CM

## 2017-03-13 NOTE — Telephone Encounter (Signed)
lmomtcb for [place] with [facility].

## 2017-03-16 NOTE — Telephone Encounter (Signed)
Please advise that insurance requires her to have new sleep study before getting new machine.  If she is okay with this, then please arrange for home sleep study.

## 2017-03-16 NOTE — Telephone Encounter (Signed)
Spoke with pt. She is aware that she needs to have a repeat sleep study. Order has been placed. Nothing further was needed.

## 2017-03-16 NOTE — Telephone Encounter (Signed)
Spoke with pt, requesting a new cpap machine.  Order placed on 3/13 for new cpap machine.  Per pt [facility] told her that "per medicare we need to start the whole process over".Hulen Skains [facility] to see what this means (pt was seen on 3/13), states that because pt has not been using cpap in over 1.5 months, she will need a new sleep study.  VS please advise on what type of sleep study this pt will need.  Thanks.

## 2017-03-19 ENCOUNTER — Ambulatory Visit (INDEPENDENT_AMBULATORY_CARE_PROVIDER_SITE_OTHER): Payer: Medicare Other | Admitting: Internal Medicine

## 2017-03-19 ENCOUNTER — Encounter: Payer: Self-pay | Admitting: Internal Medicine

## 2017-03-19 VITALS — BP 126/88 | HR 61 | Temp 98.0°F | Ht 60.0 in | Wt 128.0 lb

## 2017-03-19 DIAGNOSIS — I10 Essential (primary) hypertension: Secondary | ICD-10-CM | POA: Diagnosis not present

## 2017-03-19 DIAGNOSIS — I83813 Varicose veins of bilateral lower extremities with pain: Secondary | ICD-10-CM

## 2017-03-19 DIAGNOSIS — E785 Hyperlipidemia, unspecified: Secondary | ICD-10-CM | POA: Diagnosis not present

## 2017-03-19 DIAGNOSIS — M25571 Pain in right ankle and joints of right foot: Secondary | ICD-10-CM

## 2017-03-19 DIAGNOSIS — I83893 Varicose veins of bilateral lower extremities with other complications: Secondary | ICD-10-CM | POA: Insufficient documentation

## 2017-03-19 MED ORDER — BUDESONIDE 32 MCG/ACT NA SUSP: 1.0000 | Freq: Every day | NASAL | 1 refills | Status: DC
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
## 2017-03-19 NOTE — Patient Instructions (Signed)
Please continue all other medications as before, and refills have been done if requested.  Please have the pharmacy call with any other refills you may need.  Please continue your efforts at being more active, low cholesterol diet, and weight control.  You are otherwise up to date with prevention measures today.  Please keep your appointments with your specialists as you may have planned  You will be contacted regarding the referral for: Dr Tamala Julian  - sports medicine - or you can stop at the checkout desk for an appt  You will be contacted regarding the referral for: vascular surgury for the veins  Please go to the LAB in the Basement (turn left off the elevator) for the tests to be done today  You will be contacted by phone if any changes need to be made immediately.  Otherwise, you will receive a letter about your results with an explanation, but please check with MyChart first.  Please remember to sign up for MyChart if you have not done so, as this will be important to you in the future with finding out test results, communicating by private email, and scheduling acute appointments online when needed.  If you have Medicare related insurance (such as traditional Medicare, Blue [employer] or [employer], or similar), Please make an appointment at the [facility] with Sharee Pimple, the [employer], for your Wellness Visit in this office, which is a benefit with your insurance.  Please return in 6 months, or sooner if needed

## 2017-03-19 NOTE — Progress Notes (Signed)
Pre visit review using our clinic review tool, if applicable. No additional management support is needed unless otherwise documented below in the visit note. 

## 2017-03-21 NOTE — Assessment & Plan Note (Signed)
Mild to mod, ? Tarsal tunnel syndrome vs other, for pain control, refer Dr Smith/sport med, to f/u any worsening symptoms or concerns

## 2017-03-21 NOTE — Assessment & Plan Note (Signed)
With discomfort, ok for vasc surgury referral per pt request

## 2017-03-21 NOTE — Assessment & Plan Note (Signed)
stable overall by history and exam, recent data reviewed with pt, and pt to continue medical treatment as before,  to f/u any worsening symptoms or concerns Lab Results  Component Value Date   [place] 81 02/13/2015   For f/u labs

## 2017-03-21 NOTE — Assessment & Plan Note (Signed)
stable overall by history and exam, recent data reviewed with pt, and pt to continue medical treatment as before,  to f/u any worsening symptoms or concerns BP Readings from Last 3 Encounters:  03/19/17 126/88  03/10/17 130/80  02/18/17 158/95

## 2017-03-24 ENCOUNTER — Telehealth: Payer: Self-pay | Admitting: Pulmonary Disease

## 2017-03-24 NOTE — Telephone Encounter (Signed)
Pt aware that we are getting message to VS nurse to advise on spot to use.

## 2017-03-25 ENCOUNTER — Ambulatory Visit: Payer: Medicare Other | Admitting: Pulmonary Disease

## 2017-03-25 NOTE — Telephone Encounter (Signed)
[phone] pt calling back

## 2017-03-25 NOTE — Telephone Encounter (Signed)
LM x 1 VS is booked up at this point.  Pt scheduled for 05/15/17 with VS already, were they still wanting something sooner or are they okay with waiting until May? If wanting sooner appt, will need to schedule with NP.

## 2017-03-25 NOTE — Telephone Encounter (Signed)
LM for patient to return call.  Pt scheduled for 05/15/17 with VS already, were they wanting something sooner?  VS is booked up at this point.

## 2017-03-26 NOTE — Telephone Encounter (Signed)
Please reminder pt that she needs to have repeat sleep study first before insurance will cover new CPAP machine >> please see phone note from 03/13/17 for details.   She has been scheduled for home sleep study.  Once this is done, will then be able to send order for new CPAP machine.

## 2017-03-26 NOTE — Telephone Encounter (Signed)
Called and spoke to pt. Pt states she is having issues with her CPAP and is requesting a new CPAP. Pt states she already spoke with the DME and they are needing an order for new CPAP, pt states she has had her current unit for more than 5 years. Pt requested an earlier appt with a provider. Appt made with SG on 04/08/17.   Dr. Halford Chessman please advise if ok to send order for new CPAP.

## 2017-03-26 NOTE — Telephone Encounter (Signed)
LMTCB

## 2017-03-30 NOTE — Telephone Encounter (Signed)
[phone]  pt calling back

## 2017-03-30 NOTE — Telephone Encounter (Signed)
The patient was scheduled for 04/01/2017 @ 2:30pm to come in and pick up the HST machine. I have called her and had to lvm for her to confirm our appt for 04/01/2017 @ 2:30pm

## 2017-03-30 NOTE — Telephone Encounter (Signed)
Spoke with the pt  She states she is aware she needs the sleep study, but nobody has called her to set this up yet I see that the order was already placed  PCC's please advise thanks

## 2017-03-31 ENCOUNTER — Other Ambulatory Visit: Payer: Self-pay | Admitting: Internal Medicine

## 2017-04-02 ENCOUNTER — Encounter: Payer: Self-pay | Admitting: Vascular Surgery

## 2017-04-02 DIAGNOSIS — G4733 Obstructive sleep apnea (adult) (pediatric): Secondary | ICD-10-CM | POA: Diagnosis not present

## 2017-04-05 ENCOUNTER — Telehealth: Payer: Self-pay | Admitting: Pulmonary Disease

## 2017-04-05 DIAGNOSIS — G4733 Obstructive sleep apnea (adult) (pediatric): Secondary | ICD-10-CM

## 2017-04-05 NOTE — Telephone Encounter (Signed)
HST 04/02/17 >> AHI 24.9, SaO2 low 70%   Will have my nurse inform pt that sleep study shows moderate obstructive sleep apnea.  Please ensure that her DME gets copy of test result, and that she is set up with new auto CPAP machine pressure range 5 to 15 cm H2O with heated humidity.  Have ROV set up 2 months after getting new CPAP machine.

## 2017-04-06 DIAGNOSIS — G4733 Obstructive sleep apnea (adult) (pediatric): Secondary | ICD-10-CM | POA: Diagnosis not present

## 2017-04-07 ENCOUNTER — Other Ambulatory Visit: Payer: Self-pay | Admitting: *Deleted

## 2017-04-07 DIAGNOSIS — G4733 Obstructive sleep apnea (adult) (pediatric): Secondary | ICD-10-CM

## 2017-04-08 ENCOUNTER — Ambulatory Visit: Payer: Medicare Other | Admitting: Acute Care

## 2017-04-10 NOTE — Telephone Encounter (Signed)
Results have been explained to patient, pt expressed understanding.  Order placed for new CPAP machine -- pt requests small, portable, travel size machine.  Pt to call back to schedule her appt for 2 month ROV when she gets back in town from vacation. Nothing further needed.
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
## 2017-04-14 ENCOUNTER — Ambulatory Visit (INDEPENDENT_AMBULATORY_CARE_PROVIDER_SITE_OTHER): Payer: Medicare Other | Admitting: Vascular Surgery

## 2017-04-14 ENCOUNTER — Ambulatory Visit: Payer: Self-pay

## 2017-04-14 ENCOUNTER — Encounter: Payer: Self-pay | Admitting: Family Medicine

## 2017-04-14 ENCOUNTER — Ambulatory Visit (INDEPENDENT_AMBULATORY_CARE_PROVIDER_SITE_OTHER): Payer: Medicare Other | Admitting: Family Medicine

## 2017-04-14 ENCOUNTER — Telehealth: Payer: Self-pay | Admitting: Internal Medicine

## 2017-04-14 ENCOUNTER — Encounter: Payer: Self-pay | Admitting: Vascular Surgery

## 2017-04-14 VITALS — BP 126/82 | HR 72 | Wt 132.0 lb

## 2017-04-14 VITALS — BP 156/93 | HR 62 | Temp 98.3°F | Resp 16 | Ht 60.0 in | Wt 122.0 lb

## 2017-04-14 DIAGNOSIS — M19071 Primary osteoarthritis, right ankle and foot: Secondary | ICD-10-CM | POA: Insufficient documentation

## 2017-04-14 DIAGNOSIS — M25571 Pain in right ankle and joints of right foot: Secondary | ICD-10-CM | POA: Diagnosis not present

## 2017-04-14 DIAGNOSIS — I83893 Varicose veins of bilateral lower extremities with other complications: Secondary | ICD-10-CM

## 2017-04-14 MED ORDER — ZOSTER VAC RECOMB ADJUVANTED 50 MCG/0.5ML IM SUSR: 0.5000 mL | Freq: Once | INTRAMUSCULAR | 1 refills | Status: AC

## 2017-04-14 NOTE — Assessment & Plan Note (Signed)
Patient does have what appears to be moderate to severe arthritic changes. Patient given home exercises, discussed over-the-counter bracing that can be beneficial. Given trial topical anti-inflammatories. We discussed icing regimen. We discussed proper shoes. Follow-up as needed.

## 2017-04-14 NOTE — Telephone Encounter (Signed)
Has been sent to the pharmacy.

## 2017-04-14 NOTE — Patient Instructions (Signed)
Good to see you.  Ice 20 minutes 2 times daily. Usually after activity and before bed. Exercises 3 times a week.  [email or web address] size medium couldd be a nice daily brace to help with any swelling.  pennsaid pinkie amount topically 2 times daily as needed.  See me when you need me  Enjoy [place]

## 2017-04-14 NOTE — Telephone Encounter (Signed)
Patient is requesting to get the shingrix rx. If approved please sent to patient pharmacy.  [facility]- [phone]

## 2017-04-14 NOTE — Progress Notes (Signed)
Pre-visit discussion using our clinic review tool. No additional management support is needed unless otherwise documented below in the visit note.  
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
## 2017-04-15 ENCOUNTER — Ambulatory Visit: Payer: Medicare Other | Admitting: Acute Care

## 2017-05-15 ENCOUNTER — Ambulatory Visit: Payer: Medicare Other | Admitting: Pulmonary Disease

## 2017-06-15 ENCOUNTER — Encounter: Payer: Self-pay | Admitting: Pulmonary Disease

## 2017-06-15 ENCOUNTER — Ambulatory Visit (INDEPENDENT_AMBULATORY_CARE_PROVIDER_SITE_OTHER): Payer: Medicare Other | Admitting: Pulmonary Disease

## 2017-06-15 VITALS — BP 122/72 | HR 70 | Ht 60.0 in | Wt 128.4 lb

## 2017-06-15 DIAGNOSIS — G4733 Obstructive sleep apnea (adult) (pediatric): Secondary | ICD-10-CM | POA: Diagnosis not present

## 2017-06-15 MED ORDER — BUDESONIDE 32 MCG/ACT NA SUSP: 1.0000 | Freq: Every day | NASAL | 1 refills | Status: DC

## 2017-06-15 NOTE — Patient Instructions (Signed)
Call if you decide to get a CPAP machine on your own >> can look up machines on [email or web address]  Follow up in 6 months

## 2017-06-15 NOTE — Progress Notes (Signed)
Current Outpatient Prescriptions on File Prior to Visit  Medication Sig  . aspirin (ASPIRIN LOW DOSE) 81 MG tablet Take 81 mg by mouth daily.    Marland Kitchen atorvastatin (LIPITOR) 40 MG tablet TAKE 1 TABLET DAILY  . AZO-CRANBERRY PO Take by mouth. Use as directed   . Cholecalciferol (VITAMIN D3) 2000 UNITS capsule Take 1,000 Units by mouth daily.   . diazepam (VALIUM) 5 MG tablet Take 1 tablet (5 mg total) by mouth every 12 (twelve) hours as needed for anxiety.  Marland Kitchen levothyroxine (SYNTHROID, LEVOTHROID) 75 MCG tablet TAKE 1 TABLET DAILY  . lisinopril (PRINIVIL,ZESTRIL) 40 MG tablet TAKE 1 TABLET (40 MG TOTAL) DAILY  . meclizine (ANTIVERT) 25 MG tablet TAKE 1/2 TO 1 TABLET BY MOUTH EVERY 6 HOURS AS NEEDED  . montelukast (SINGULAIR) 10 MG tablet Take 1 tablet (10 mg total) by mouth daily.  . Multiple Vitamins-Minerals (WOMENS 50+ MULTI VITAMIN/MIN PO) Take by mouth.  . Omega-3 Fatty Acids (OMEGA 3 PO) Take by mouth.  Marland Kitchen PREMARIN 0.3 MG tablet TAKE 1 TABLET DAILY   No current facility-administered medications on file prior to visit.     Chief Complaint  Patient presents with  . Follow-up    Pt never got new CPAP machine. Pt was told that she was not eligible for another machine at this time. Pt has been using her CPAP since last visit. DME: [facility] ; Needs refill of Rhinocort.     Sleep tests PSG 10/03/12 >> AHI 23.4, SpO2 low 72%. Complex sleep apnea. PLMI 1. CPAP 11/01/12 >> CPAP 7 cm H2O>>AHI 2.2, +R Auto CPAP 11/27/16 to 02/24/17 >> used on 43 of 90 nights with average 4 hrs 53 min.  Average AHI 9 with median CPAP 10 and 95 th percentile CPAP 13 cm H2O HST 04/02/17 >> AHI 24.9, SaO2 low 70%  Past medical history hypothyroidism, HLD, HTN, Anxiety, GERD  Past surgical history, Family history, Social history, Allergies reviewed  Vital signs BP 122/72 (BP Location: Left Arm, Cuff Size: Normal)   Pulse 70   Ht 5' (1.524 m)   Wt 128 lb 6.4 oz (58.2 kg)   SpO2 97%   BMI 25.08 kg/m    History of  Present Illness: [name] is a 83 y.o. female with obstructive sleep apnea.  Plan was to have her get new CPAP machine.  She was told by DME that she needed new sleep study.  This showed moderate sleep apnea.  She was then told by DME that she had to wait full 5 yrs before being eligible for new machine >> likely in November.  Physical Exam:  General - pleasant Eyes - pupils reactive ENT - no sinus tenderness, no oral exudate, no LAN, scalloped tongue Cardiac - regular, no murmur Chest - no wheeze, rales Abd - soft, non tender Ext - no edema Skin - no rashes Neuro - normal strength Psych - normal mood   Assessment/Plan:  Obstructive sleep apnea. - she will check on line for CPAP options - otherwise can order new CPAP after 5 yr mark is met >> she would like resmed mini auto set  Allergic rhinitis. - refilled rhinocort  Patient Instructions  Call if you decide to get a CPAP machine on your own >> can look up machines on [email or web address]  Follow up in 6 months    Chesley Mires, MD [facility] Pager:  [phone] 06/15/2017, 2:57 PM

## 2017-06-19 ENCOUNTER — Telehealth: Payer: Self-pay | Admitting: Pulmonary Disease

## 2017-06-19 MED ORDER — FLUTICASONE PROPIONATE 50 MCG/ACT NA SUSP: 1.0000 | Freq: Every day | NASAL | 2 refills | Status: DC

## 2017-06-19 NOTE — Telephone Encounter (Signed)
Rx has been sent in. Nothing further was needed. 

## 2017-06-19 NOTE — Telephone Encounter (Signed)
Received PA information for Rhinocort This is not a covered drug and they are unable to do PA on this.  Alternative is Flonase.  Please advise Dr Halford Chessman if okay to recommend switch to OTC Flonase. Thanks.

## 2017-06-19 NOTE — Telephone Encounter (Signed)
Switch to flonase.

## 2017-07-21 ENCOUNTER — Other Ambulatory Visit: Payer: Self-pay | Admitting: Internal Medicine

## 2017-08-07 ENCOUNTER — Other Ambulatory Visit: Payer: Self-pay | Admitting: Internal Medicine

## 2017-08-15 ENCOUNTER — Other Ambulatory Visit: Payer: Self-pay | Admitting: Internal Medicine

## 2017-08-17 ENCOUNTER — Telehealth: Payer: Self-pay | Admitting: Internal Medicine

## 2017-08-17 ENCOUNTER — Other Ambulatory Visit: Payer: Self-pay | Admitting: Internal Medicine

## 2017-08-17 MED ORDER — MECLIZINE HCL 25 MG PO TABS: ORAL_TABLET | ORAL | 0 refills | Status: DC

## 2017-08-17 NOTE — Telephone Encounter (Signed)
Reviewed chart pt is up-to-date sent refills to [facility]...[name]

## 2017-08-17 NOTE — Telephone Encounter (Signed)
Pt called for a refill of her meclizine (ANTIVERT) 25 MG tablet  Last seen 02/217 Please advise  [facility] [address]

## 2017-08-20 ENCOUNTER — Emergency Department (HOSPITAL_COMMUNITY): Payer: Medicare Other

## 2017-08-20 ENCOUNTER — Encounter (HOSPITAL_COMMUNITY): Payer: Self-pay

## 2017-08-20 ENCOUNTER — Observation Stay (HOSPITAL_COMMUNITY)
Admission: EM | Admit: 2017-08-20 | Discharge: 2017-08-21 | Disposition: A | Discharge status: Home / Self Care | Payer: Medicare Other | Attending: Internal Medicine | Admitting: Internal Medicine

## 2017-08-20 ENCOUNTER — Telehealth: Payer: Self-pay | Admitting: Internal Medicine

## 2017-08-20 DIAGNOSIS — G4733 Obstructive sleep apnea (adult) (pediatric): Secondary | ICD-10-CM | POA: Diagnosis not present

## 2017-08-20 DIAGNOSIS — I10 Essential (primary) hypertension: Secondary | ICD-10-CM | POA: Diagnosis not present

## 2017-08-20 DIAGNOSIS — I700 Atherosclerosis of aorta: Secondary | ICD-10-CM | POA: Insufficient documentation

## 2017-08-20 DIAGNOSIS — E039 Hypothyroidism, unspecified: Secondary | ICD-10-CM | POA: Diagnosis present

## 2017-08-20 DIAGNOSIS — J45909 Unspecified asthma, uncomplicated: Secondary | ICD-10-CM | POA: Diagnosis not present

## 2017-08-20 DIAGNOSIS — Z79899 Other long term (current) drug therapy: Secondary | ICD-10-CM | POA: Diagnosis not present

## 2017-08-20 DIAGNOSIS — E785 Hyperlipidemia, unspecified: Secondary | ICD-10-CM | POA: Insufficient documentation

## 2017-08-20 DIAGNOSIS — R0781 Pleurodynia: Principal | ICD-10-CM | POA: Insufficient documentation

## 2017-08-20 DIAGNOSIS — E559 Vitamin D deficiency, unspecified: Secondary | ICD-10-CM | POA: Insufficient documentation

## 2017-08-20 DIAGNOSIS — R091 Pleurisy: Secondary | ICD-10-CM | POA: Diagnosis not present

## 2017-08-20 DIAGNOSIS — Z87891 Personal history of nicotine dependence: Secondary | ICD-10-CM | POA: Insufficient documentation

## 2017-08-20 DIAGNOSIS — Z7982 Long term (current) use of aspirin: Secondary | ICD-10-CM | POA: Insufficient documentation

## 2017-08-20 DIAGNOSIS — J9811 Atelectasis: Secondary | ICD-10-CM | POA: Insufficient documentation

## 2017-08-20 DIAGNOSIS — R0789 Other chest pain: Secondary | ICD-10-CM | POA: Diagnosis not present

## 2017-08-20 DIAGNOSIS — R079 Chest pain, unspecified: Secondary | ICD-10-CM | POA: Diagnosis not present

## 2017-08-20 LAB — CBC
HEMATOCRIT: 41.4 % (ref 36.0–46.0)
HEMOGLOBIN: 13.9 g/dL (ref 12.0–15.0)
MCH: 31.2 pg (ref 26.0–34.0)
MCHC: 33.6 g/dL (ref 30.0–36.0)
MCV: 93 fL (ref 78.0–100.0)
Platelets: 259 10*3/uL (ref 150–400)
RBC: 4.45 MIL/uL (ref 3.87–5.11)
RDW: 13.4 % (ref 11.5–15.5)
WBC: 6.3 10*3/uL (ref 4.0–10.5)

## 2017-08-20 LAB — BASIC METABOLIC PANEL
ANION GAP: 6 (ref 5–15)
BUN: 19 mg/dL (ref 6–20)
CHLORIDE: 105 mmol/L (ref 101–111)
CO2: 31 mmol/L (ref 22–32)
Calcium: 9.3 mg/dL (ref 8.9–10.3)
Creatinine, Ser: 0.86 mg/dL (ref 0.44–1.00)
GFR calc Af Amer: >60 mL/min (ref >60)
GLUCOSE: 77 mg/dL (ref 65–99)
POTASSIUM: 4.3 mmol/L (ref 3.5–5.1)
Sodium: 142 mmol/L (ref 135–145)

## 2017-08-20 LAB — TROPONIN I: Troponin I: <0.03 ng/mL (ref <0.03)

## 2017-08-20 LAB — POCT I-STAT TROPONIN I: TROPONIN I, POC: 0 ng/mL (ref 0.00–0.08)

## 2017-08-20 MED ORDER — ENOXAPARIN SODIUM 40 MG/0.4ML ~~LOC~~ SOLN
40.0000 mg | Freq: Every day | SUBCUTANEOUS | Status: DC
  Administered 2017-08-20: 40 mg via SUBCUTANEOUS
  Filled 2017-08-20: qty 0.4

## 2017-08-20 MED ORDER — ASPIRIN 81 MG PO CHEW
81.0000 mg | CHEWABLE_TABLET | Freq: Every day | ORAL | Status: DC
  Administered 2017-08-21: 81 mg via ORAL
  Filled 2017-08-20: qty 1

## 2017-08-20 MED ORDER — ACETAMINOPHEN 325 MG PO TABS: 650.0000 mg | ORAL_TABLET | ORAL | Status: DC | PRN

## 2017-08-20 MED ORDER — LISINOPRIL 20 MG PO TABS
40.0000 mg | ORAL_TABLET | Freq: Every day | ORAL | Status: DC
  Administered 2017-08-21: 40 mg via ORAL
  Filled 2017-08-20: qty 2

## 2017-08-20 MED ORDER — SODIUM CHLORIDE 0.9 % IV BOLUS (SEPSIS)
1000.0000 mL | Freq: Once | INTRAVENOUS | Status: AC
  Administered 2017-08-20: 1000 mL via INTRAVENOUS

## 2017-08-20 MED ORDER — ONDANSETRON HCL 4 MG/2ML IJ SOLN: 4.0000 mg | Freq: Four times a day (QID) | INTRAMUSCULAR | Status: DC | PRN

## 2017-08-20 MED ORDER — IOPAMIDOL (ISOVUE-370) INJECTION 76%
INTRAVENOUS | Status: AC
  Filled 2017-08-20: qty 100

## 2017-08-20 MED ORDER — LEVOTHYROXINE SODIUM 75 MCG PO TABS: 75.0000 ug | ORAL_TABLET | Freq: Every day | ORAL | Status: DC

## 2017-08-20 MED ORDER — ATORVASTATIN CALCIUM 40 MG PO TABS: 40.0000 mg | ORAL_TABLET | Freq: Every day | ORAL | Status: DC

## 2017-08-20 MED ORDER — MONTELUKAST SODIUM 10 MG PO TABS
10.0000 mg | ORAL_TABLET | Freq: Every day | ORAL | Status: DC
  Administered 2017-08-21: 10 mg via ORAL
  Filled 2017-08-20: qty 1

## 2017-08-20 MED ORDER — IOPAMIDOL (ISOVUE-370) INJECTION 76%
100.0000 mL | Freq: Once | INTRAVENOUS | Status: AC | PRN
  Administered 2017-08-20: 100 mL via INTRAVENOUS

## 2017-08-20 NOTE — ED Triage Notes (Addendum)
Patient came to [facility] in personal vehicle by self after calling her PCP for an appointment and being told to go to the ER. Patient reports left sided chest pain that radiates to her back, starting approx 3 days ago. Patient reports "its not a constant pain...it comes and goes when I take a breath." Patient denies falls/truama. Pt has history of HTN, but denies heart history. Patient denies Nausea/vomiting/diarrhea. Patient denies SOB. EKG completed in triage.

## 2017-08-20 NOTE — Telephone Encounter (Signed)
[place] [facility] - Client [place] [facility]     Patient Name: [facility] Initial Comment Caller states she's having pain in her chest.  DOB: [birth year]      Nurse Assessment  Nurse: Luther Parody, RN, Malachy Mood Date/Time (Eastern Time): 08/20/2017 10:56:05 AM  Confirm and document reason for call. If symptomatic, describe symptoms. ---Caller states that she has been having intermittent cp for the last 3 days. States that the pain is sharp in nature and occurs with inspiration or deep breath. Pain is present now with inspiration.   Does the patient have any new or worsening symptoms? ---Yes   Will a triage be completed? ---Yes   Related visit to physician within the last 2 weeks? ---No   Does the PT have any chronic conditions? (i.e. diabetes, asthma, etc.) ---Yes   List chronic conditions. ---htn   Is this a behavioral health or substance abuse call? ---No     Guidelines     Guideline Title Affirmed Question Affirmed Notes   Chest Pain [1] Chest pain lasts > 5 minutes AND [2] age > 33    Final Disposition User   Call EMS 911 Now Luther Parody, [place], [employer]     Referrals   [facility] - ED   Disagree/Comply: Comply

## 2017-08-20 NOTE — ED Notes (Signed)
Called floor to give report. Nurse is busy will call the Ed back. Time documented
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
## 2017-08-20 NOTE — ED Notes (Signed)
Pt had drawn in triage for labs: Gold Lavender Blue Lt green Dark green
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
## 2017-08-21 ENCOUNTER — Observation Stay (HOSPITAL_BASED_OUTPATIENT_CLINIC_OR_DEPARTMENT_OTHER): Payer: Medicare Other

## 2017-08-21 ENCOUNTER — Other Ambulatory Visit (HOSPITAL_COMMUNITY): Payer: Medicare Other

## 2017-08-21 DIAGNOSIS — I340 Nonrheumatic mitral (valve) insufficiency: Secondary | ICD-10-CM

## 2017-08-21 DIAGNOSIS — R091 Pleurisy: Secondary | ICD-10-CM

## 2017-08-21 DIAGNOSIS — R0781 Pleurodynia: Secondary | ICD-10-CM | POA: Diagnosis not present

## 2017-08-21 DIAGNOSIS — I351 Nonrheumatic aortic (valve) insufficiency: Secondary | ICD-10-CM | POA: Diagnosis not present

## 2017-08-21 DIAGNOSIS — R0789 Other chest pain: Secondary | ICD-10-CM | POA: Diagnosis not present

## 2017-08-21 DIAGNOSIS — I10 Essential (primary) hypertension: Secondary | ICD-10-CM

## 2017-08-21 DIAGNOSIS — E039 Hypothyroidism, unspecified: Secondary | ICD-10-CM

## 2017-08-21 DIAGNOSIS — R071 Chest pain on breathing: Secondary | ICD-10-CM

## 2017-08-21 LAB — ECHOCARDIOGRAM COMPLETE
HEIGHTINCHES: 60 in
WEIGHTICAEL: 2064 [oz_av]

## 2017-08-21 LAB — TROPONIN I: Troponin I: <0.03 ng/mL (ref <0.03)

## 2017-08-21 MED ORDER — LEVOFLOXACIN 500 MG PO TABS
500.0000 mg | ORAL_TABLET | Freq: Every day | ORAL | Status: DC
  Administered 2017-08-21: 500 mg via ORAL
  Filled 2017-08-21: qty 1

## 2017-08-21 MED ORDER — KETOROLAC TROMETHAMINE 15 MG/ML IJ SOLN
15.0000 mg | Freq: Four times a day (QID) | INTRAMUSCULAR | Status: DC
  Administered 2017-08-21: 15 mg via INTRAVENOUS
  Filled 2017-08-21: qty 1

## 2017-08-21 MED ORDER — NAPROXEN SODIUM 220 MG PO CAPS: 220.0000 mg | ORAL_CAPSULE | Freq: Two times a day (BID) | ORAL | 0 refills | Status: DC

## 2017-08-21 MED ORDER — LEVOFLOXACIN 500 MG PO TABS: 500.0000 mg | ORAL_TABLET | Freq: Every day | ORAL | 0 refills | Status: DC

## 2017-08-21 NOTE — Progress Notes (Signed)
  Echocardiogram 2D Echocardiogram has been performed.  [name] 08/21/2017, 2:00 PM

## 2017-08-21 NOTE — Progress Notes (Signed)
PROGRESS NOTE    [name]  KZL:[id number] DOB: 01/01/1933 DOA: 08/20/2017 PCP: Biagio Borg, MD  Brief Narrative: 83 year old female with history of hypertension admitted from the ER last night after chief complaint of pleuritic chest pain and cough. No history of MI/CAD no diabetes. CTA chest negative for PE-Focal ground-glass opacity is noted in the right upper lobe. This may represent focal inflammation   Assessment & Plan:   Pleuritic chest pain -Associated with cough, chest pain is worse with deep inspiration and cough -CT chest notes focal opacity in right upper lobe, and given productive cough for 2-3 days will treat with antibiotics. -Troponin negative no evidence of ACS -Check 2-D echocardiogram -Toradol for symptom management    Hypothyroidism -Continue Synthroid    Essential hypertension -Continue lisinopril  DVT prophylaxis: Lovenox Code Status: Full code Family Communication: No family at bedside  Disposition Plan: Home pending above workup   Antimicrobials:   Levaquin   Subjective: Feels better  Objective: Vitals:   08/20/17 2000 08/20/17 2100 08/20/17 2249 08/21/17 0503  BP: (!) 169/97 136/75 (!) 128/109 (!) 141/76  Pulse: 63 60 62 63  Resp: 15 15  16   Temp:   (!) 97.3 F (36.3 C) 98.4 F (36.9 C)  TempSrc:   Oral Oral  SpO2: 100% 97% 98% 94%  Weight:   58.5 kg (129 lb)   Height:        Intake/Output Summary (Last 24 hours) at 08/21/17 1422 Last data filed at 08/21/17 1300  Gross per 24 hour  Intake              240 ml  Output                0 ml  Net              240 ml   Filed Weights   08/20/17 1245 08/20/17 2249  Weight: 58.1 kg (128 lb) 58.5 kg (129 lb)    Examination:  General exam: Appears calm and comfortable  Respiratory system: few ronchi in RUL Cardiovascular system: S1 & S2 heard, RRR. No JVD, murmurs, rubs, gallops or clicks. No pedal edema. Gastrointestinal system: Abdomen is nondistended, soft and  nontender.  Normal bowel sounds heard. Central nervous system: Alert and oriented. No focal neurological deficits. Extremities: Symmetric 5 x 5 power. Skin: No rashes, lesions or ulcers Psychiatry: Judgement and insight appear normal. Mood & affect appropriate.     Data Reviewed:   CBC:  Recent Labs Lab 08/20/17 1427  WBC 6.3  HGB 13.9  HCT 41.4  MCV 93.0  PLT 390   Basic Metabolic Panel:  Recent Labs Lab 08/20/17 1427  NA 142  K 4.3  CL 105  CO2 31  GLUCOSE 77  BUN 19  CREATININE 0.86  CALCIUM 9.3   GFR: Estimated Creatinine Clearance: 40.4 mL/min (by C-G formula based on SCr of 0.86 mg/dL). Liver Function Tests: No results for input(s): AST, ALT, ALKPHOS, BILITOT, PROT, ALBUMIN in the last 168 hours. No results for input(s): LIPASE, AMYLASE in the last 168 hours. No results for input(s): AMMONIA in the last 168 hours. Coagulation Profile: No results for input(s): INR, PROTIME in the last 168 hours. Cardiac Enzymes:  Recent Labs Lab 08/20/17 2240 08/21/17 0631  TROPONINI <0.03 <0.03   BNP (last 3 results) No results for input(s): PROBNP in the last 8760 hours. HbA1C: No results for input(s): HGBA1C in the last 72 hours. CBG: No results for input(s): GLUCAP in  the last 168 hours. Lipid Profile: No results for input(s): CHOL, HDL, LDLCALC, TRIG, CHOLHDL, LDLDIRECT in the last 72 hours. Thyroid Function Tests: No results for input(s): TSH, T4TOTAL, FREET4, T3FREE, THYROIDAB in the last 72 hours. Anemia Panel: No results for input(s): VITAMINB12, FOLATE, FERRITIN, TIBC, IRON, RETICCTPCT in the last 72 hours. Urine analysis:    Component Value Date/Time   COLORURINE YELLOW 02/13/2015 [place] 02/13/2015 1041   LABSPEC 1.020 02/13/2015 1041   PHURINE 6.0 02/13/2015 1041   GLUCOSEU NEGATIVE 02/13/2015 1041   HGBUR TRACE-INTACT (A) 02/13/2015 1041   BILIRUBINUR negative 12/15/2016 1646   KETONESUR NEGATIVE 02/13/2015 1041   PROTEINUR  positive 12/15/2016 1646   UROBILINOGEN 0.2 12/15/2016 1646   UROBILINOGEN 0.2 02/13/2015 1041   NITRITE negative 12/15/2016 1646   NITRITE NEGATIVE 02/13/2015 1041   LEUKOCYTESUR moderate (2+) (A) 12/15/2016 1646   Sepsis Labs: @LABRCNTIP (procalcitonin:4,lacticidven:4)  )No results found for this or any previous visit (from the past 240 hour(s)).       Radiology Studies: Dg Chest 2 View  Result Date: 08/20/2017 CLINICAL DATA:  Chest pain for 3 days.  No known injury. EXAM: CHEST  2 VIEW COMPARISON:  None. FINDINGS: The lungs are clear. Heart size is normal. No pneumothorax or pleural fluid. Aortic atherosclerosis noted. No acute bony abnormality. IMPRESSION: No acute disease. Atherosclerosis. Electronically Signed   By: Inge Rise M.D.   On: 08/20/2017 14:06   Ct Angio Chest Pe W And/or Wo Contrast  Result Date: 08/20/2017 CLINICAL DATA:  Chest pain. EXAM: CT ANGIOGRAPHY CHEST WITH CONTRAST TECHNIQUE: Multidetector CT imaging of the chest was performed using the standard protocol during bolus administration of intravenous contrast. Multiplanar CT image reconstructions and MIPs were obtained to evaluate the vascular anatomy. CONTRAST:  100 mL of Isovue 370 intravenously. COMPARISON:  Radiographs of same day. FINDINGS: Cardiovascular: Satisfactory opacification of the pulmonary arteries to the segmental level. No evidence of pulmonary embolism. Normal heart size. No pericardial effusion. Atherosclerosis of thoracic aorta is noted without aneurysm formation. Mediastinum/Nodes: No enlarged mediastinal, hilar, or axillary lymph nodes. Thyroid gland, trachea, and esophagus demonstrate no significant findings. Lungs/Pleura: No pneumothorax or pleural effusion is noted. Probable atelectasis is noted anteriorly in the left upper lobe. Minimal right posterior basilar subsegmental atelectasis is noted. Focal ground-glass opacity is noted anteriorly in the right upper lobe measuring 15 x 11 mm.  Upper Abdomen: No acute abnormality. Musculoskeletal: No chest wall abnormality. No acute or significant osseous findings. Review of the MIP images confirms the above findings. IMPRESSION: No definite evidence of pulmonary embolus. Focal ground-glass opacity is noted in the right upper lobe. This may represent focal inflammation, but follow-up unenhanced chest CT scan in 3 months is recommended to ensure stability and rule out the potential of neoplasm. Aortic Atherosclerosis (ICD10-I70.0). Electronically Signed   By: Marijo Conception, M.D.   On: 08/20/2017 18:30        Scheduled Meds: . aspirin  81 mg Oral Daily  . atorvastatin  40 mg Oral q1800  . enoxaparin (LOVENOX) injection  40 mg Subcutaneous QHS  . ketorolac  15 mg Intravenous Q6H  . levofloxacin  500 mg Oral Daily  . levothyroxine  75 mcg Oral QAC breakfast  . lisinopril  40 mg Oral Daily  . montelukast  10 mg Oral Daily   Continuous Infusions:   LOS: 0 days    Time spent: 30min    Domenic Polite, MD [facility] Pager [phone]  If 7PM-7AM,  please contact night-coverage [email or web address] Password [email or web address] 08/21/2017, 2:22 PM

## 2017-08-21 NOTE — Care Management Note (Signed)
Case Management Note  Patient Details  Name: [name] MRN: [id number] Date of Birth: 1933/10/05  Subjective/Objective:                  83 y o female with hx of HTN who came with cc of chest pain for the past 3 days, substernal, aggravated with deep breathing, radiating to back sometimes, non exertional, non positional with no dyspnea/cough/fever/chills/trauama. No prior hx that is similar. No hx of MI/CAD/LHC. No other complaints. Hx of recent travel to [place] taking the plane for several hours but no leg swelling or hx of VTE.   Action/Plan: Date:  August 21, 2017 Chart reviewed for concurrent status and case management needs. Will continue to follow patient progress. Discharge Planning: following for needs Expected discharge date: [id number] Velva Harman, BSN, [place], [place]  Expected Discharge Date:   (unknown)               Expected Discharge Plan:  Home/Self Care  In-House Referral:     Discharge planning Services  CM Consult  Post Acute Care Choice:    Choice offered to:     DME Arranged:    DME Agency:     HH Arranged:    [place] Agency:     Status of Service:  In process, will continue to follow  If discussed at Long Length of Stay Meetings, dates discussed:    Additional Comments:  Leeroy Cha, RN 08/21/2017, 9:12 AM

## 2017-08-24 NOTE — Discharge Summary (Addendum)
Physician Discharge Summary  [name] HAL:[id number] DOB: 1933-07-02 DOA: 08/20/2017  PCP: Biagio Borg, MD  Admit date: 08/20/2017 Discharge date: 08/21/2017  Time spent: 35 minutes  Recommendations for Outpatient Follow-up:  1. PCP in 1 week 2. FU CT chest in 3-82months to re-access the RUL ground glass opacity/inflammation   Discharge Diagnoses:  Principal Problem:   Atypical chest pain   Right upper lobe inflammation noted on CT chest   Hypothyroidism   Essential hypertension   Pleurisy   Discharge Condition: stable  Diet recommendation: heart healthy  Filed Weights   08/20/17 1245 08/20/17 2249  Weight: 58.1 kg (128 lb) 58.5 kg (129 lb)    History of present illness:  83 year old female with history of hypertension admitted from the ER last night after chief complaint of pleuritic chest pain and cough. No history of MI/CAD no diabetes. CTA chest negative for PE-Focal ground-glass opacity is noted in the right upper lobe. This may represent focal inflammation  Hospital Course:  Pleuritic chest pain -Associated with cough, chest pain is worse with deep inspiration and cough -CTA chest notes no PE, but focal opacity in right upper lobe, and given productive cough for 2-3 days treated with antibiotics and NSAIDs for pleurisy, improving on this symptomatically -Troponin negative no evidence of ACS -2-D echocardiogram showed normal EF and no wall motion abnormality -discharged home on PO ABx and advised Fu Ct chest in 3-55months    Hypothyroidism -Continue Synthroid    Essential hypertension -Continue lisinopril  Discharge Exam: Vitals:   08/21/17 0503 08/21/17 1422  BP: (!) 141/76 103/84  Pulse: 63 60  Resp: 16 16  Temp: 98.4 F (36.9 C) 98 F (36.7 C)  SpO2: 94% 98%    General: AAOx3 Cardiovascular: S1S2/RRR Respiratory: CTAB  Discharge Instructions   Discharge Instructions    Diet - low sodium heart healthy    Complete by:  As directed     Increase activity slowly    Complete by:  As directed      Discharge Medication List as of 08/21/2017  4:34 PM    START taking these medications   Details  levofloxacin (LEVAQUIN) 500 MG tablet Take 1 tablet (500 mg total) by mouth daily. For 6days, Starting Sat 08/22/2017, Print    Naproxen Sodium 220 MG CAPS Take 1 capsule (220 mg total) by mouth 2 (two) times daily. For 2days, then STOP, Starting Fri 08/21/2017, Print      CONTINUE these medications which have NOT CHANGED   Details  aspirin (ASPIRIN LOW DOSE) 81 MG tablet Take 81 mg by mouth daily.  , Historical Med    atorvastatin (LIPITOR) 40 MG tablet TAKE 1 TABLET DAILY, Normal    Cholecalciferol (VITAMIN D3) 2000 UNITS capsule Take 2,000 Units by mouth daily. , Historical Med    levothyroxine (SYNTHROID, LEVOTHROID) 75 MCG tablet TAKE 1 TABLET DAILY, Normal    lisinopril (PRINIVIL,ZESTRIL) 40 MG tablet TAKE 1 TABLET (40 MG TOTAL) DAILY, Normal    montelukast (SINGULAIR) 10 MG tablet TAKE 1 TABLET DAILY, Normal    Multiple Vitamins-Minerals (WOMENS 50+ MULTI VITAMIN/MIN PO) Take 1 tablet by mouth daily. , Historical Med    PREMARIN 0.3 MG tablet TAKE 1 TABLET DAILY, Normal    budesonide (RHINOCORT AQUA) 32 MCG/ACT nasal spray Place 1 spray into both nostrils daily., Starting Mon 06/15/2017, Normal    meclizine (ANTIVERT) 25 MG tablet TAKE 1/2 TO 1 TABLET BY MOUTH EVERY 6 HOURS AS NEEDED, Normal  STOP taking these medications     diazepam (VALIUM) 5 MG tablet      fluticasone (FLONASE) 50 MCG/ACT nasal spray        Allergies  Allergen Reactions  . Clarithromycin Nausea Only   Follow-up Information    Biagio Borg, MD. Schedule an appointment as soon as possible for a visit in 1 week(s).   Specialties:  Internal Medicine, Radiology Contact information: [address] [place] [address] [phone]            The results of significant diagnostics from this hospitalization (including  imaging, microbiology, ancillary and laboratory) are listed below for reference.    Significant Diagnostic Studies: Dg Chest 2 View  Result Date: 08/20/2017 CLINICAL DATA:  Chest pain for 3 days.  No known injury. EXAM: CHEST  2 VIEW COMPARISON:  None. FINDINGS: The lungs are clear. Heart size is normal. No pneumothorax or pleural fluid. Aortic atherosclerosis noted. No acute bony abnormality. IMPRESSION: No acute disease. Atherosclerosis. Electronically Signed   By: Inge Rise M.D.   On: 08/20/2017 14:06   Ct Angio Chest Pe W And/or Wo Contrast  Result Date: 08/20/2017 CLINICAL DATA:  Chest pain. EXAM: CT ANGIOGRAPHY CHEST WITH CONTRAST TECHNIQUE: Multidetector CT imaging of the chest was performed using the standard protocol during bolus administration of intravenous contrast. Multiplanar CT image reconstructions and MIPs were obtained to evaluate the vascular anatomy. CONTRAST:  100 mL of Isovue 370 intravenously. COMPARISON:  Radiographs of same day. FINDINGS: Cardiovascular: Satisfactory opacification of the pulmonary arteries to the segmental level. No evidence of pulmonary embolism. Normal heart size. No pericardial effusion. Atherosclerosis of thoracic aorta is noted without aneurysm formation. Mediastinum/Nodes: No enlarged mediastinal, hilar, or axillary lymph nodes. Thyroid gland, trachea, and esophagus demonstrate no significant findings. Lungs/Pleura: No pneumothorax or pleural effusion is noted. Probable atelectasis is noted anteriorly in the left upper lobe. Minimal right posterior basilar subsegmental atelectasis is noted. Focal ground-glass opacity is noted anteriorly in the right upper lobe measuring 15 x 11 mm. Upper Abdomen: No acute abnormality. Musculoskeletal: No chest wall abnormality. No acute or significant osseous findings. Review of the MIP images confirms the above findings. IMPRESSION: No definite evidence of pulmonary embolus. Focal ground-glass opacity is noted in the  right upper lobe. This may represent focal inflammation, but follow-up unenhanced chest CT scan in 3 months is recommended to ensure stability and rule out the potential of neoplasm. Aortic Atherosclerosis (ICD10-I70.0). Electronically Signed   By: Marijo Conception, M.D.   On: 08/20/2017 18:30    Microbiology: No results found for this or any previous visit (from the past 240 hour(s)).   Labs: Basic Metabolic Panel:  Recent Labs Lab 08/20/17 1427  NA 142  K 4.3  CL 105  CO2 31  GLUCOSE 77  BUN 19  CREATININE 0.86  CALCIUM 9.3   Liver Function Tests: No results for input(s): AST, ALT, ALKPHOS, BILITOT, PROT, ALBUMIN in the last 168 hours. No results for input(s): LIPASE, AMYLASE in the last 168 hours. No results for input(s): AMMONIA in the last 168 hours. CBC:  Recent Labs Lab 08/20/17 1427  WBC 6.3  HGB 13.9  HCT 41.4  MCV 93.0  PLT 259   Cardiac Enzymes:  Recent Labs Lab 08/20/17 2240 08/21/17 0631  TROPONINI <0.03 <0.03   BNP: BNP (last 3 results) No results for input(s): BNP in the last 8760 hours.  ProBNP (last 3 results) No results for input(s): PROBNP in the  last 8760 hours.  CBG: No results for input(s): GLUCAP in the last 168 hours.     SignedDomenic Polite MD.  [facility] 08/24/2017, 4:04 PM

## 2017-09-04 ENCOUNTER — Encounter: Payer: Self-pay | Admitting: Internal Medicine

## 2017-09-04 ENCOUNTER — Ambulatory Visit (INDEPENDENT_AMBULATORY_CARE_PROVIDER_SITE_OTHER): Payer: Medicare Other | Admitting: Internal Medicine

## 2017-09-04 VITALS — BP 124/78 | HR 67 | Temp 97.9°F | Ht 60.0 in | Wt 129.0 lb

## 2017-09-04 DIAGNOSIS — J111 Influenza due to unidentified influenza virus with other respiratory manifestations: Secondary | ICD-10-CM

## 2017-09-04 DIAGNOSIS — Z23 Encounter for immunization: Secondary | ICD-10-CM

## 2017-09-04 DIAGNOSIS — R079 Chest pain, unspecified: Secondary | ICD-10-CM

## 2017-09-04 DIAGNOSIS — R9389 Abnormal findings on diagnostic imaging of other specified body structures: Secondary | ICD-10-CM

## 2017-09-04 DIAGNOSIS — R938 Abnormal findings on diagnostic imaging of other specified body structures: Secondary | ICD-10-CM | POA: Diagnosis not present

## 2017-09-04 DIAGNOSIS — I10 Essential (primary) hypertension: Secondary | ICD-10-CM

## 2017-09-04 NOTE — Patient Instructions (Signed)
You had the flu shot today  Please continue all other medications as before, and refills have been done if requested.  Please have the pharmacy call with any other refills you may need.  Please keep your appointments with your specialists as you may have planned  Please return in 3 months, or sooner if needed
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
## 2017-09-05 DIAGNOSIS — R9389 Abnormal findings on diagnostic imaging of other specified body structures: Secondary | ICD-10-CM | POA: Insufficient documentation

## 2017-09-05 NOTE — Assessment & Plan Note (Signed)
Resolved with tx of underlying pulm inflammation/infection, now asympt and improved, no further tx needed but will recommend f/u 3 mo to repeat CT chest

## 2017-09-05 NOTE — Assessment & Plan Note (Signed)
stable overall by history and exam, recent data reviewed with pt, and pt to continue medical treatment as before,  to f/u any worsening symptoms or concerns  

## 2017-09-05 NOTE — Assessment & Plan Note (Signed)
Exam benign today, for f/u CT in 3 months

## 2017-12-26 ENCOUNTER — Other Ambulatory Visit: Payer: Self-pay | Admitting: Internal Medicine
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
## 2017-12-31 ENCOUNTER — Ambulatory Visit (INDEPENDENT_AMBULATORY_CARE_PROVIDER_SITE_OTHER): Payer: Medicare Other | Admitting: *Deleted

## 2017-12-31 VITALS — BP 128/74 | HR 78 | Resp 20 | Ht 60.0 in | Wt 131.0 lb

## 2017-12-31 DIAGNOSIS — Z0000 Encounter for general adult medical examination without abnormal findings: Secondary | ICD-10-CM | POA: Diagnosis not present

## 2017-12-31 DIAGNOSIS — E2839 Other primary ovarian failure: Secondary | ICD-10-CM | POA: Diagnosis not present

## 2017-12-31 NOTE — Patient Instructions (Addendum)
[facility] has audiology Department   levaquin or levofloxacinis was the antibiotic prescribed to you.  Continue doing brain stimulating activities (puzzles, reading, adult coloring books, staying active) to keep memory sharp.   Continue to eat heart healthy diet (full of fruits, vegetables, whole grains, lean protein, water--limit salt, fat, and sugar intake) and increase physical activity as tolerated.   [name] , Thank you for taking time to come for your Medicare Wellness Visit. I appreciate your ongoing commitment to your health goals. Please review the following plan we discussed and let me know if I can assist you in the future.   These are the goals we discussed: Goals    . Patient Stated     Increase how often I exercise from 2 times weekly to 4 times weekly. Increase daily water intake.       This is a list of the screening recommended for you and due dates:  Health Maintenance  Topic Date Due  . Tetanus Vaccine  12/04/2018  . Flu Shot  Completed  . DEXA scan (bone density measurement)  Completed  . Pneumonia vaccines  Completed

## 2018-01-07 ENCOUNTER — Ambulatory Visit (INDEPENDENT_AMBULATORY_CARE_PROVIDER_SITE_OTHER)
Admission: RE | Admit: 2018-01-07 | Discharge: 2018-01-07 | Disposition: A | Discharge status: Home / Self Care | Payer: Medicare Other | Source: Ambulatory Visit | Attending: Internal Medicine | Admitting: Internal Medicine

## 2018-01-07 DIAGNOSIS — E2839 Other primary ovarian failure: Secondary | ICD-10-CM | POA: Diagnosis not present

## 2018-01-10 ENCOUNTER — Encounter: Payer: Self-pay | Admitting: Internal Medicine

## 2018-02-23 ENCOUNTER — Ambulatory Visit (INDEPENDENT_AMBULATORY_CARE_PROVIDER_SITE_OTHER): Payer: Medicare Other | Admitting: Pulmonary Disease

## 2018-02-23 ENCOUNTER — Encounter: Payer: Self-pay | Admitting: Pulmonary Disease

## 2018-02-23 VITALS — BP 122/74 | HR 63 | Ht 60.0 in | Wt 130.0 lb

## 2018-02-23 DIAGNOSIS — G4733 Obstructive sleep apnea (adult) (pediatric): Secondary | ICD-10-CM

## 2018-02-23 DIAGNOSIS — R911 Solitary pulmonary nodule: Secondary | ICD-10-CM | POA: Diagnosis not present

## 2018-02-23 NOTE — Patient Instructions (Signed)
Will schedule CT chest w/o contrast  Will call with results and then let you know if you need to schedule a follow up visit
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
## 2018-03-04 ENCOUNTER — Ambulatory Visit (INDEPENDENT_AMBULATORY_CARE_PROVIDER_SITE_OTHER)
Admission: RE | Admit: 2018-03-04 | Discharge: 2018-03-04 | Disposition: A | Discharge status: Home / Self Care | Payer: Medicare Other | Source: Ambulatory Visit | Attending: Pulmonary Disease | Admitting: Pulmonary Disease

## 2018-03-04 DIAGNOSIS — R911 Solitary pulmonary nodule: Secondary | ICD-10-CM

## 2018-03-04 DIAGNOSIS — R918 Other nonspecific abnormal finding of lung field: Secondary | ICD-10-CM | POA: Diagnosis not present

## 2018-03-09 ENCOUNTER — Telehealth: Payer: Self-pay | Admitting: Pulmonary Disease

## 2018-03-09 DIAGNOSIS — R918 Other nonspecific abnormal finding of lung field: Secondary | ICD-10-CM

## 2018-03-09 NOTE — Telephone Encounter (Signed)
CT chest 03/04/18 >> 1.5 x 1.2 cm GGO RML, 1.1 cm subpleural focus LLL   Attempted to contact pt.    Please let her know that the spot in her Rt middle lung has increased in size.  She needs to get a PET scan to get a better idea about how active the spot is.  Based on the PET scan results, she might then need a biopsy of the spot in her Rt middle lung.    If she is okay with this plan, then please place order for PET scan.  If she has questions, then please get a phone number I can contact her at.

## 2018-03-09 NOTE — Telephone Encounter (Signed)
Pt is calling back about the result for the CT    [phone]

## 2018-03-11 NOTE — Telephone Encounter (Signed)
Called and spoke with patient regarding results.  Informed the patient of results and recommendations today. Pt verbalized understanding and denied any questions or concerns at this time.  Pt was okay with placed PET scan today Placed PET scan today per VS recommendations

## 2018-03-12 NOTE — Telephone Encounter (Signed)
Will close encounter, as nothing further is needed.  

## 2018-03-19 ENCOUNTER — Encounter (HOSPITAL_COMMUNITY)
Admission: RE | Admit: 2018-03-19 | Discharge: 2018-03-19 | Disposition: A | Discharge status: Home / Self Care | Payer: Medicare Other | Source: Ambulatory Visit | Attending: Pulmonary Disease | Admitting: Pulmonary Disease

## 2018-03-19 DIAGNOSIS — R911 Solitary pulmonary nodule: Secondary | ICD-10-CM | POA: Diagnosis not present

## 2018-03-19 DIAGNOSIS — R918 Other nonspecific abnormal finding of lung field: Secondary | ICD-10-CM | POA: Diagnosis not present

## 2018-03-19 LAB — GLUCOSE, CAPILLARY: GLUCOSE-CAPILLARY: 87 mg/dL (ref 65–99)

## 2018-03-19 MED ORDER — FLUDEOXYGLUCOSE F - 18 (FDG) INJECTION
6.4800 | Freq: Once | INTRAVENOUS | Status: AC | PRN
  Administered 2018-03-19: 6.48 via INTRAVENOUS

## 2018-03-23 ENCOUNTER — Telehealth: Payer: Self-pay | Admitting: Pulmonary Disease

## 2018-03-23 DIAGNOSIS — R918 Other nonspecific abnormal finding of lung field: Secondary | ICD-10-CM

## 2018-03-23 NOTE — Telephone Encounter (Signed)
PET scan 03/19/18 >> GGO RML 16 x 12 mm.   Results d/w pt.  Will arrange for biopsy with interventional radiology.

## 2018-03-23 NOTE — Telephone Encounter (Signed)
Spoke with pt. She is requesting her PET scan results from 03/19/18.  VS - please advise. Thanks.

## 2018-03-26 ENCOUNTER — Other Ambulatory Visit: Payer: Self-pay | Admitting: Internal Medicine

## 2018-03-30 ENCOUNTER — Other Ambulatory Visit: Payer: Self-pay | Admitting: Radiology

## 2018-03-30 ENCOUNTER — Telehealth: Payer: Self-pay | Admitting: Internal Medicine

## 2018-03-30 NOTE — Telephone Encounter (Signed)
Informed pt, she expressed understanding

## 2018-03-30 NOTE — Telephone Encounter (Signed)
Copied from [place] [id number]. Topic: General - Other >> Mar 30, 2018  2:26 PM Darl Householder, RMA wrote: Reason for CRM: patient is requesting a call back concerning CT scan

## 2018-03-30 NOTE — Telephone Encounter (Signed)
I looked at recent events, with PET/CT scan c/w enlarging lung lesion  OK to let pt know I agree with need for f/u tomorrow (I think this is likely at CT guided biopsy)

## 2018-03-30 NOTE — Telephone Encounter (Signed)
Pt called and would like for Dr. Jenny Reichmann to look at her CT done on 3/7, would like a second opinion, she is getting a repeat done tomorrow.  Please call back

## 2018-03-31 ENCOUNTER — Ambulatory Visit (HOSPITAL_COMMUNITY)
Admission: RE | Admit: 2018-03-31 | Discharge: 2018-03-31 | Disposition: A | Discharge status: Home / Self Care | Payer: Medicare Other | Source: Ambulatory Visit | Attending: Interventional Radiology | Admitting: Interventional Radiology

## 2018-03-31 ENCOUNTER — Encounter (HOSPITAL_COMMUNITY): Payer: Self-pay

## 2018-03-31 ENCOUNTER — Ambulatory Visit (HOSPITAL_COMMUNITY)
Admission: RE | Admit: 2018-03-31 | Discharge: 2018-03-31 | Disposition: A | Discharge status: Home / Self Care | Payer: Medicare Other | Source: Ambulatory Visit | Attending: Pulmonary Disease | Admitting: Pulmonary Disease

## 2018-03-31 DIAGNOSIS — G4733 Obstructive sleep apnea (adult) (pediatric): Secondary | ICD-10-CM | POA: Diagnosis not present

## 2018-03-31 DIAGNOSIS — C3411 Malignant neoplasm of upper lobe, right bronchus or lung: Secondary | ICD-10-CM | POA: Diagnosis not present

## 2018-03-31 DIAGNOSIS — Z79890 Hormone replacement therapy (postmenopausal): Secondary | ICD-10-CM | POA: Insufficient documentation

## 2018-03-31 DIAGNOSIS — Z801 Family history of malignant neoplasm of trachea, bronchus and lung: Secondary | ICD-10-CM | POA: Diagnosis not present

## 2018-03-31 DIAGNOSIS — Z90710 Acquired absence of both cervix and uterus: Secondary | ICD-10-CM | POA: Diagnosis not present

## 2018-03-31 DIAGNOSIS — E039 Hypothyroidism, unspecified: Secondary | ICD-10-CM | POA: Diagnosis not present

## 2018-03-31 DIAGNOSIS — Z79899 Other long term (current) drug therapy: Secondary | ICD-10-CM | POA: Diagnosis not present

## 2018-03-31 DIAGNOSIS — E559 Vitamin D deficiency, unspecified: Secondary | ICD-10-CM | POA: Insufficient documentation

## 2018-03-31 DIAGNOSIS — R918 Other nonspecific abnormal finding of lung field: Secondary | ICD-10-CM

## 2018-03-31 DIAGNOSIS — I10 Essential (primary) hypertension: Secondary | ICD-10-CM | POA: Insufficient documentation

## 2018-03-31 DIAGNOSIS — Z8249 Family history of ischemic heart disease and other diseases of the circulatory system: Secondary | ICD-10-CM | POA: Insufficient documentation

## 2018-03-31 DIAGNOSIS — Z823 Family history of stroke: Secondary | ICD-10-CM | POA: Diagnosis not present

## 2018-03-31 DIAGNOSIS — J939 Pneumothorax, unspecified: Secondary | ICD-10-CM | POA: Diagnosis not present

## 2018-03-31 DIAGNOSIS — K219 Gastro-esophageal reflux disease without esophagitis: Secondary | ICD-10-CM | POA: Insufficient documentation

## 2018-03-31 DIAGNOSIS — E785 Hyperlipidemia, unspecified: Secondary | ICD-10-CM | POA: Insufficient documentation

## 2018-03-31 DIAGNOSIS — Z9889 Other specified postprocedural states: Secondary | ICD-10-CM

## 2018-03-31 DIAGNOSIS — R911 Solitary pulmonary nodule: Secondary | ICD-10-CM | POA: Diagnosis not present

## 2018-03-31 DIAGNOSIS — Z87891 Personal history of nicotine dependence: Secondary | ICD-10-CM | POA: Insufficient documentation

## 2018-03-31 DIAGNOSIS — Z7982 Long term (current) use of aspirin: Secondary | ICD-10-CM | POA: Diagnosis not present

## 2018-03-31 DIAGNOSIS — Z8781 Personal history of (healed) traumatic fracture: Secondary | ICD-10-CM | POA: Diagnosis not present

## 2018-03-31 LAB — CBC
HCT: 41.3 % (ref 36.0–46.0)
Hemoglobin: 13.5 g/dL (ref 12.0–15.0)
MCH: 30.9 pg (ref 26.0–34.0)
MCHC: 32.7 g/dL (ref 30.0–36.0)
MCV: 94.5 fL (ref 78.0–100.0)
PLATELETS: 208 10*3/uL (ref 150–400)
RBC: 4.37 MIL/uL (ref 3.87–5.11)
RDW: 14.1 % (ref 11.5–15.5)
WBC: 5.3 10*3/uL (ref 4.0–10.5)

## 2018-03-31 LAB — PROTIME-INR
INR: 0.96
Prothrombin Time: 12.7 seconds (ref 11.4–15.2)

## 2018-03-31 MED ORDER — FENTANYL CITRATE (PF) 100 MCG/2ML IJ SOLN
INTRAMUSCULAR | Status: AC
  Filled 2018-03-31: qty 4

## 2018-03-31 MED ORDER — HYDROCODONE-ACETAMINOPHEN 5-325 MG PO TABS: 1.0000 | ORAL_TABLET | ORAL | Status: DC | PRN

## 2018-03-31 MED ORDER — MIDAZOLAM HCL 2 MG/2ML IJ SOLN
INTRAMUSCULAR | Status: AC
  Filled 2018-03-31: qty 4

## 2018-03-31 MED ORDER — LIDOCAINE HCL 1 % IJ SOLN
INTRAMUSCULAR | Status: AC
  Filled 2018-03-31: qty 20

## 2018-03-31 MED ORDER — FENTANYL CITRATE (PF) 100 MCG/2ML IJ SOLN
INTRAMUSCULAR | Status: AC | PRN
  Administered 2018-03-31: 50 ug via INTRAVENOUS

## 2018-03-31 MED ORDER — SODIUM CHLORIDE 0.9 % IV SOLN: INTRAVENOUS | Status: DC

## 2018-03-31 MED ORDER — SODIUM CHLORIDE 0.9 % IV SOLN
INTRAVENOUS | Status: AC | PRN
  Administered 2018-03-31: 10 mL/h via INTRAVENOUS

## 2018-03-31 MED ORDER — MIDAZOLAM HCL 2 MG/2ML IJ SOLN
INTRAMUSCULAR | Status: AC | PRN
  Administered 2018-03-31: 1 mg via INTRAVENOUS
  Administered 2018-03-31: 0.5 mg via INTRAVENOUS

## 2018-03-31 NOTE — Progress Notes (Signed)
Portable chest xray done.

## 2018-03-31 NOTE — Sedation Documentation (Signed)
Patient is resting comfortably. 

## 2018-03-31 NOTE — Progress Notes (Signed)
PA paged regarding post-procedure CXR.  Reviewed with Dr. Vernard Gambles.  Imaging stable since the time of the procedure.  Patient remains asymptomatic.  No new orders.  Continue with current interventions and recovery trajectory at this time.  RN aware.  Brynda Greathouse, MS RD PA-C 2:52 PM

## 2018-03-31 NOTE — Procedures (Addendum)
  Procedure: CT core RML lung lesion biopsy 18g x3 EBL:   minimal Complications:  none immediate  See full dictation in [facility].  Dillard Cannon MD Main # [phone] Pager  [phone]
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
## 2018-03-31 NOTE — Discharge Instructions (Signed)
Lung Biopsy, Care After °This sheet gives you information about how to care for yourself after your procedure. Your health care provider may also give you more specific instructions depending on the type of biopsy you had. If you have problems or questions, contact your health care provider. °What can I expect after the procedure? °After the procedure, it is common to have: °· A cough. °· A sore throat. °· Pain where a needle, bronchoscope, or incision was used to collect a biopsy sample (biopsy site). ° °Follow these instructions at home: °Medicines °· Take over-the-counter and prescription medicines only as told by your health care provider. °· Do not drive for 24 hours if you were given a sedative. °· Do not drink alcohol while taking pain medicine. °· Do not drive or use heavy machinery while taking prescription pain medicine. °· To prevent or treat constipation while you are taking prescription pain medicine, your health care provider may recommend that you: °? Drink enough fluid to keep your urine clear or pale yellow. °? Take over-the-counter or prescription medicines. °? Eat foods that are high in fiber, such as fresh fruits and vegetables, whole grains, and beans. °? Limit foods that are high in fat and processed sugars, such as fried and sweet foods. °Activity °· If you had an incision during your procedure, avoid activities that may pull the incision site open. °· Return to your normal activities as told by your health care provider. Ask your health care provider what activities are safe for you. °If you had an open biopsy:  °· Follow instructions from your health care provider about how to take care of your incision. Make sure you: °? Wash your hands with soap and water before you change your bandage (dressing). If soap and water are not available, use hand sanitizer. °? Change your dressing as told by your health care provider. °? Leave stitches (sutures), skin glue, or adhesive strips in place. These  skin closures may need to stay in place for 2 weeks or longer. If adhesive strip edges start to loosen and curl up, you may trim the loose edges. Do not remove adhesive strips completely unless your health care provider tells you to do that. °· Check your incision area every day for signs of infection. Check for: °? Redness, swelling, or pain. °? Fluid or blood. °? Warmth. °? Pus or a bad smell. °General instructions °· It is up to you to get the results of your procedure. Ask your health care provider, or the department that is doing the procedure, when your results will be ready. °Contact a health care provider if: °· You have a fever. °· You have redness, swelling, or pain around your biopsy site. °· You have fluid or blood coming from your biopsy site. °· Your biopsy site feels warm to the touch. °· You have pus or a bad smell coming from your biopsy site. °Get help right away if: °· You cough up blood. °· You have trouble breathing. °· You have chest pain. °Summary °· After the procedure, it is common to have a sore throat and a cough. °· Return to your normal activities as told by your health care provider. Ask your health care provider what activities are safe for you. °· Take over-the-counter and prescription medicines only as told by your health care provider. °· Report any unusual symptoms to your health care provider. °This information is not intended to replace advice given to you by your health care provider. Make sure   you discuss any questions you have with your health care provider. °Document Released: 01/13/2017 Document Revised: 01/13/2017 Document Reviewed: 01/13/2017 °Elsevier Interactive Patient Education © 2018 [employer] ° °

## 2018-04-01 ENCOUNTER — Telehealth: Payer: Self-pay | Admitting: Pulmonary Disease

## 2018-04-01 ENCOUNTER — Encounter: Payer: Self-pay | Admitting: Pulmonary Disease

## 2018-04-01 DIAGNOSIS — C3491 Malignant neoplasm of unspecified part of right bronchus or lung: Secondary | ICD-10-CM

## 2018-04-01 HISTORY — DX: Malignant neoplasm of unspecified part of right bronchus or lung: C34.91

## 2018-04-01 NOTE — Telephone Encounter (Signed)
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  Will arrange for referral to multidisciplinary thoracic oncology conference.

## 2018-04-08 ENCOUNTER — Other Ambulatory Visit: Payer: Self-pay | Admitting: *Deleted

## 2018-04-09 ENCOUNTER — Telehealth: Payer: Self-pay | Admitting: *Deleted

## 2018-04-09 NOTE — Telephone Encounter (Signed)
Oncology Nurse Navigator Documentation  Oncology Nurse Navigator Flowsheets 04/09/2018  Navigator Location [facility]-  Navigator Encounter Type Telephone/I received referral on Ms. Yerkes. I called and scheduled her to be seen at [facility] on 04/15/18.  She verbalized understanding of appt time and place   Telephone Outgoing Call  Treatment Phase Pre-Tx/Tx Discussion  Barriers/Navigation Needs Coordination of Care  Interventions Coordination of Care  Coordination of Care Other  Acuity Level 1  Time Spent with Patient 15

## 2018-04-15 ENCOUNTER — Other Ambulatory Visit: Payer: Self-pay

## 2018-04-15 ENCOUNTER — Ambulatory Visit: Payer: Medicare Other | Attending: Pulmonary Disease | Admitting: Physical Therapy

## 2018-04-15 ENCOUNTER — Ambulatory Visit
Admission: RE | Admit: 2018-04-15 | Discharge: 2018-04-15 | Disposition: A | Discharge status: Home / Self Care | Payer: Medicare Other | Source: Ambulatory Visit | Attending: Radiation Oncology | Admitting: Radiation Oncology

## 2018-04-15 ENCOUNTER — Other Ambulatory Visit: Payer: Self-pay | Admitting: *Deleted

## 2018-04-15 ENCOUNTER — Ambulatory Visit (INDEPENDENT_AMBULATORY_CARE_PROVIDER_SITE_OTHER): Payer: Medicare Other | Admitting: Cardiothoracic Surgery

## 2018-04-15 VITALS — BP 183/97 | HR 65 | Temp 97.8°F | Resp 17 | Wt 130.4 lb

## 2018-04-15 DIAGNOSIS — C342 Malignant neoplasm of middle lobe, bronchus or lung: Secondary | ICD-10-CM

## 2018-04-15 DIAGNOSIS — C3491 Malignant neoplasm of unspecified part of right bronchus or lung: Secondary | ICD-10-CM | POA: Diagnosis not present

## 2018-04-15 DIAGNOSIS — C3411 Malignant neoplasm of upper lobe, right bronchus or lung: Secondary | ICD-10-CM | POA: Diagnosis not present
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
## 2018-04-15 NOTE — Patient Instructions (Signed)
Lung Cancer Lung cancer occurs when abnormal cells in the lung grow out of control and form a mass (tumor). There are several types of lung cancer. The two most common types are:  Non-small cell. In this type of lung cancer, abnormal cells are larger and grow more slowly than those of small cell lung cancer.  Small cell. In this type of lung cancer, abnormal cells are smaller than those of non-small cell lung cancer. Small cell lung cancer gets worse faster than non-small cell lung cancer.  What are the causes? The leading cause of lung cancer is smoking tobacco. The second leading cause is radon exposure. What increases the risk?  Smoking tobacco.  Exposure to secondhand tobacco smoke.  Exposure to radon gas.  Exposure to asbestos.  Exposure to arsenic in drinking water.  Air pollution.  Family or personal history of lung cancer.  Lung radiation therapy.  Being older than 65 years. What are the signs or symptoms? In the early stages, symptoms may not be present. As the cancer progresses, symptoms may include:  A lasting cough, possibly with blood.  Fatigue.  Unexplained weight loss.  Shortness of breath.  Wheezing.  Chest pain.  Loss of appetite.  Symptoms of advanced lung cancer include:  Hoarseness.  Bone or joint pain.  Weakness.  Nail problems.  Face or arm swelling.  Paralysis of the face.  Drooping eyelids.  How is this diagnosed? Lung cancer can be identified with a physical exam and with tests such as:  A chest X-ray.  A CT scan.  Blood tests.  A biopsy.  After a diagnosis is made, you will have more tests to determine the stage of the cancer. The stages of non-small cell lung cancer are:  Stage 0, also called carcinoma in situ. At this stage, abnormal cells are found in the inner lining of your lung or lungs.  Stage I. At this stage, abnormal cells have grown into a tumor that is no larger than 5 cm across. The cancer has entered  the deeper lung tissue but has not yet entered the lymph nodes or other parts of the body.  Stage II. At this stage, the tumor is 7 cm across or smaller and has entered nearby lymph nodes. Or, the tumor is 5 cm across or smaller and has invaded surrounding tissue but is not found in nearby lymph nodes. There may be more than one tumor present.  Stage III. At this stage, the tumor may be any size. There may be more than one tumor in the lungs. The cancer cells have spread to the lymph nodes and possibly to other organs.  Stage IV. At this stage, there are tumors in both lungs and the cancer has spread to other areas of the body.  The stages of small cell lung cancer are:  Limited. At this stage, the cancer is found only on one side of the chest.  Extensive. At this stage, the cancer is in the lungs and in tissues on the other side of the chest. The cancer has spread to other organs or is found in the fluid between the layers of your lungs.  How is this treated? Depending on the type and stage of your lung cancer, you may be treated with:  Surgery. This is done to remove a tumor.  Radiation therapy. This treatment destroys cancer cells using X-rays or other types of radiation.  Chemotherapy. This treatment uses medicines to destroy cancer cells.  Targeted therapy. This treatment   aims to destroy only cancer cells instead of all cells as other therapies do.  You may also have a combination of treatments. Follow these instructions at home:  Do not use any tobacco products. This includes cigarettes, chewing tobacco, and electronic cigarettes. If you need help quitting, ask your health care provider.  Take medicines only as directed by your health care provider.  Eat a healthy diet. Work with a dietitian to make sure you are getting the nutrition you need.  Consider joining a support group or seeking counseling to help you cope with the stress of having lung cancer.  Let your cancer  specialist (oncologist) know if you are admitted to the hospital.  Keep all follow-up visits as directed by your health care provider. This is important. Contact a health care provider if:  You lose weight without trying.  You have a persistent cough and wheezing.  You feel short of breath.  You tire easily.  You experience bone or joint pain.  You have difficulty swallowing.  You feel hoarse or notice your voice changing.  Your pain medicine is not helping. Get help right away if:  You cough up blood.  You have new breathing problems.  You develop chest pain.  You develop swelling in: ? One or both ankles or legs. ? Your face, neck, or arms.  You are confused.  You experience paralysis in your face or a drooping eyelid. This information is not intended to replace advice given to you by your health care provider. Make sure you discuss any questions you have with your health care provider. Document Released: 03/23/2001 Document Revised: 05/22/2016 Document Reviewed: 04/20/2014    Lung Resection A lung resection is a procedure to remove part or all of a lung. When an entire lung is removed, the procedure is called a pneumonectomy. When only part of a lung is removed, the procedure is called a lobectomy. A lung resection is typically done to get rid of a tumor or cancer, but it may be done to treat other conditions. This procedure can help relieve some or all of your symptoms and can also help keep the problem from getting worse. Lung resection may provide the best chance for curing your disease. However, the procedure may not necessarily cure lung cancer if that is the problem. Tell a health care provider about:  Any allergies you have.  All medicines you are taking, including vitamins, herbs, eye drops, creams, and over-the-counter medicines.  Any problems you or family members have had with anesthetic medicines.  Any blood disorders you have.  Any surgeries you  have had.  Any medical conditions you have. What are the risks? Generally, lung resection is a safe procedure. However, problems can occur and include:  Excessive bleeding.  Infection.  Inability to breathe without a ventilator.  Persistent shortness of breath.  Heart problems, including abnormal rhythms and a risk of heart attack or heart failure.  Blood clots.  Injury to a blood vessel.  Injury to a nerve.  Failure to heal properly.  Stroke.  Bronchopleural fistula. This is a small hole between one of the main breathing tubes (bronchus) and the lining of the lungs. This is rare.  Reaction to anesthesia.  What happens before the procedure? You may have tests done before the procedure, including:  Blood tests.  Urine tests.  X-rays.  Other imaging tests (such as CT scans, MRI scans, and PET scans). These tests are done to find the exact size and location  of the condition being treated with this surgery.  Pulmonary function tests. These are breathing tests to assess the function of your lungs before surgery and to decide how to best help your breathing after surgery.  Heart testing. This is done to make sure your heart is strong enough for the procedure.  Bronchoscopy. This is a technique that allows your health care provider to look at the inside of your airways. This is done using a soft, flexible tube (bronchoscope). Along with imaging tests, this can help your health care provider know the exact location and size of the area that will be removed during surgery.  Lymph node sampling. This may need to be done to see if the tumor has spread. It may be done as a separate surgery or right before your lung resection procedure.  What happens during the procedure?  An IV tube will be placed in your arm. You will be given a medicine that makes you fall asleep (general anesthetic). You may also get pain medicine through a thin, flexible tube (catheter) in your back.  A  breathing tube will be placed in your throat.  Once the surgical team has prepared you for surgery, your surgeon will make an incision on your side. Some resections are done through large incisions, while others can be done through small incisions using smaller instruments and assisted with small cameras (laparoscopic surgery).  Your surgeon will carefully cut the veins, arteries, and bronchus leading to your lung. After being cut, each of these pieces will be sewn or stapled closed. The lung or part of the lung will then be removed.  Your surgeon will check inside your chest to make sure there is no bleeding in or around the lungs. Lymph nodes near the lung may also be removed for later tests.  Your surgeon may put tubes into your chest to drain extra fluid and air after surgery.  Your incision will be closed. This may be done using: ? Stitches that absorb into your body and do not need to be removed. ? Stitches that must be removed. ? Staples that must be removed. What happens after the procedure?  You will be taken to the recovery area and your progress will be monitored. You may still have a breathing tube and other tubes or catheters in your body immediately after surgery. These will be removed during your recovery. You may be put on a respirator following surgery if some assistance is needed to help your breathing. When you are awake and not experiencing immediate problems from surgery, you will be moved to the intensive care unit (ICU) where you will continue your recovery.  You may feel pain in your chest and throat. Sometimes during recovery, patients may shiver or feel nauseous. You will be given medicine to help with pain and nausea.  The breathing tube will be taken out as soon as your health care providers feel you can breathe on your own. For most people, this happens on the same day as the surgery.  If your surgery and time in the ICU go well, most of the tubes and equipment will  be taken out within 1-2 days after surgery. This is about how long most people stay in the ICU. You may need to stay longer, depending on how you are doing.  You should also start respiratory therapy in the ICU. This therapy uses breathing exercises to help your other lung stay healthy and get stronger.  As you improve, you will be  moved to a regular hospital room for continued respiratory therapy, help with your bladder and bowels, and to continue medicines.  After your lung or part of your lung is taken out, there will be a space inside your chest. This space will often fill up with fluid over time. The amount of time this takes is different for each person.  You will receive care until you are doing well and your health care provider feels it is safe for you to go home or to transfer to an extended care facility. This information is not intended to replace advice given to you by your health care provider. Make sure you discuss any questions you have with your health care provider. Document Released: 03/07/2003 Document Revised: 05/22/2016 Document Reviewed: 02/03/2014 Elsevier Interactive Patient Education  2018 [employer].  Lung Resection, Care After Refer to this sheet in the next few weeks. These instructions provide you with information on caring for yourself after your procedure. Your health care provider may also give you more specific instructions. Your treatment has been planned according to current medical practices, but problems sometimes occur. Call your health care provider if you have any problems or questions after your procedure. What can I expect after the procedure? After your procedure, it is typical to have the following:  You may feel pain in your chest and throat.  Patients may sometimes shiver or feel nauseous during recovery.  Follow these instructions at home:  You may resume a normal diet and activities as directed by your health care provider.  Do not use any  tobacco products, including cigarettes, chewing tobacco, or electronic cigarettes. If you need help quitting, ask your health care provider.  There are many different ways to close and cover an incision, including stitches, skin glue, and adhesive strips. Follow your health care provider's instructions on: ? Incision care. ? Bandage (dressing) changes and removal. ? Incision closure removal.  Take medicines only as directed by your health care provider.  Keep all follow-up visits as directed by your health care provider. This is important.  Try to breathe deeply and cough as directed. Holding a pillow firmly over your ribs may help with discomfort.  If you were given an incentive spirometer in the hospital, continue to use it as directed by your health care provider.  Walk as directed by your health care provider.  You may take a shower and gently wash the area of your incision with water and soap as directed by your health care provider. Do not use anything else to clean your incision except as directed by your health care provider. Do not take baths, swim, or use a hot tub until your health care provider approves. Contact a health care provider if:  You notice redness, swelling, or increasing pain at the incision site.  You are bleeding at the incision site.  You see pus coming from the incision site.  You notice a bad smell coming from the incision site or bandage.  Your incision breaks open.  You cough up blood or pus, or you develop a cough that produces bad-smelling sputum.  You have pain or swelling in your legs.  You have increasing pain that is not controlled with medicine.  You have trouble managing any of the tubes that have been left in place after surgery.  You have fever or chills. Get help right away if:  You have chest pain or an irregular or rapid heartbeat.  You have dizzy episodes or faint.  You have shortness of breath or difficulty breathing.  You  have persistent nausea or vomiting.  You have a rash. This information is not intended to replace advice given to you by your health care provider. Make sure you discuss any questions you have with your health care provider. Document Released: 07/04/2005 Document Revised: 05/22/2016 Document Reviewed: 02/03/2014 Elsevier Interactive Patient Education  2018 [employer].

## 2018-04-16 ENCOUNTER — Encounter: Payer: Self-pay | Admitting: Cardiothoracic Surgery

## 2018-04-19 ENCOUNTER — Other Ambulatory Visit: Payer: Self-pay | Admitting: *Deleted

## 2018-04-19 DIAGNOSIS — C342 Malignant neoplasm of middle lobe, bronchus or lung: Secondary | ICD-10-CM

## 2018-04-21 ENCOUNTER — Ambulatory Visit (HOSPITAL_COMMUNITY)
Admission: RE | Admit: 2018-04-21 | Discharge: 2018-04-21 | Disposition: A | Discharge status: Home / Self Care | Payer: Medicare Other | Source: Ambulatory Visit | Attending: Cardiothoracic Surgery | Admitting: Cardiothoracic Surgery

## 2018-04-21 DIAGNOSIS — Z87891 Personal history of nicotine dependence: Secondary | ICD-10-CM | POA: Insufficient documentation

## 2018-04-21 DIAGNOSIS — C342 Malignant neoplasm of middle lobe, bronchus or lung: Secondary | ICD-10-CM | POA: Insufficient documentation

## 2018-04-21 DIAGNOSIS — J449 Chronic obstructive pulmonary disease, unspecified: Secondary | ICD-10-CM | POA: Insufficient documentation

## 2018-04-21 LAB — PULMONARY FUNCTION TEST
DL/VA % pred: 117 %
DL/VA: 4.98 ml/min/mmHg/L
DLCO unc % pred: 87 %
DLCO unc: 16.44 ml/min/mmHg
FEF 25-75 Post: 1.28 L/sec
FEF 25-75 Pre: 0.71 L/sec
FEF2575-%Change-Post: 80 %
FEF2575-%Pred-Post: 137 %
FEF2575-%Pred-Pre: 76 %
FEV1-%Change-Post: 13 %
FEV1-%Pred-Post: 134 %
FEV1-%Pred-Pre: 118 %
FEV1-Post: 1.52 L
FEV1-Pre: 1.34 L
FEV1FVC-%Change-Post: 14 %
FEV1FVC-%Pred-Pre: 90 %
FEV6-%Change-Post: 0 %
FEV6-%Pred-Post: 141 %
FEV6-%Pred-Pre: 141 %
FEV6-Post: 1.96 L
FEV6-Pre: 1.97 L
FEV6FVC-%Pred-Post: 106 %
FEV6FVC-%Pred-Pre: 106 %
FVC-%Change-Post: 0 %
FVC-%Pred-Post: 132 %
FVC-%Pred-Pre: 133 %
FVC-Post: 1.96 L
FVC-Pre: 1.97 L
Post FEV1/FVC ratio: 78 %
Post FEV6/FVC ratio: 100 %
Pre FEV1/FVC ratio: 68 %
Pre FEV6/FVC Ratio: 100 %
RV % pred: 70 %
RV: 1.59 L
TLC % pred: 81 %
TLC: 3.61 L

## 2018-04-21 MED ORDER — ALBUTEROL SULFATE (2.5 MG/3ML) 0.083% IN NEBU
2.5000 mg | INHALATION_SOLUTION | Freq: Once | RESPIRATORY_TRACT | Status: AC
  Administered 2018-04-21: 2.5 mg via RESPIRATORY_TRACT

## 2018-04-22 ENCOUNTER — Encounter: Payer: Self-pay | Admitting: Cardiothoracic Surgery

## 2018-04-22 ENCOUNTER — Ambulatory Visit (INDEPENDENT_AMBULATORY_CARE_PROVIDER_SITE_OTHER): Payer: Medicare Other | Admitting: Cardiothoracic Surgery

## 2018-04-22 ENCOUNTER — Other Ambulatory Visit: Payer: Self-pay

## 2018-04-22 VITALS — BP 152/80 | HR 70 | Ht 60.0 in | Wt 130.0 lb

## 2018-04-22 DIAGNOSIS — C3491 Malignant neoplasm of unspecified part of right bronchus or lung: Secondary | ICD-10-CM | POA: Diagnosis not present

## 2018-04-22 DIAGNOSIS — C342 Malignant neoplasm of middle lobe, bronchus or lung: Secondary | ICD-10-CM | POA: Diagnosis not present

## 2018-04-22 NOTE — Patient Instructions (Signed)
Stop Lisinopril after the morning dose Saturday May 4 Do not take Advil, motrin, Aleve  or similar meds Nothing by mouth after midnight Sunday night
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
## 2018-04-28 NOTE — Pre-Procedure Instructions (Signed)
[name]  04/28/2018      [id number] [name], [address] [name] [address] Phone: [phone] Fax: [phone]  [facility] [place] [address] [place] [address] Phone: [phone] Fax: [phone]  [facility] [place] [place] [address] Phone: [phone] Fax: [phone]    Your procedure is scheduled on May 6  Report to [facility] at 530 A.M.  Call this number if you have problems the morning of surgery:  [phone]   Remember:  Do not eat food or drink liquids after midnight.  Take these medicines the morning of surgery with A SIP OF WATER Rhinocort nasal spray if needed  Stop taking BC's, Goody's, Herbal medications, Fish Oil, Ibuprofen, Advil, Motrin, Aleve, Vitamins Stop taking aspirin as directed by your Dr.    [name] not wear jewelry, make-up or nail polish.  Do not wear lotions, powders, or perfumes, or deodorant.  Do not shave 48 hours prior to surgery.  Men may shave face and neck.  Do not bring valuables to the hospital.  [facility] is not responsible for any belongings or valuables.  Contacts, dentures or bridgework may not be worn into surgery.  Leave your suitcase in the car.  After surgery it may be brought to your room.  For patients admitted to the hospital, discharge time will be determined by your treatment team.  Patients discharged the day of surgery will not be allowed to drive home.    Special instructions:   [facility]- Preparing For Surgery  Before surgery, you can play an important role. Because skin is not sterile, your skin needs to be as free of germs as possible. You can reduce the number of germs on your skin by washing with CHG (chlorahexidine gluconate) Soap before surgery.  CHG is  an antiseptic cleaner which kills germs and bonds with the skin to continue killing germs even after washing.  Please do not use if you have an allergy to CHG or antibacterial soaps. If your skin becomes reddened/irritated stop using the CHG.  Do not shave (including legs and underarms) for at least 48 hours prior to first CHG shower. It is OK to shave your face.  Please follow these instructions carefully.   1. Shower the NIGHT BEFORE SURGERY and the MORNING OF SURGERY with CHG.   2. If you chose to wash your hair, wash your hair first as usual with your normal shampoo.  3. After you shampoo, rinse your hair and body thoroughly to remove the shampoo.  4. Use CHG as you would any other liquid soap. You can apply CHG directly to the skin and wash gently with a scrungie or a clean washcloth.   5. Apply the CHG Soap to your body ONLY FROM THE NECK DOWN.  Do not use on open wounds or open sores. Avoid contact with your eyes, ears, mouth and genitals (private parts). Wash Face and genitals (private parts)  with your normal soap.  6. Wash thoroughly, paying special attention to the area where your surgery will be performed.  7. Thoroughly rinse your body with warm water from the neck down.  8. DO NOT shower/wash with your normal soap after using and rinsing off the CHG Soap.  9. [name]  yourself dry with a CLEAN TOWEL.  10. Wear CLEAN PAJAMAS to bed the night before surgery, wear comfortable clothes the morning of surgery  11. Place CLEAN SHEETS on your bed the night of your first shower and DO NOT SLEEP WITH PETS.    Day of Surgery: Do not apply any deodorants/lotions. Please wear clean clothes to the hospital/surgery center.      Please read over the following fact sheets that you were given. Pain Booklet, Coughing and Deep Breathing, MRSA Information and Surgical Site Infection Prevention

## 2018-04-29 ENCOUNTER — Other Ambulatory Visit: Payer: Self-pay

## 2018-04-29 ENCOUNTER — Encounter (HOSPITAL_COMMUNITY): Payer: Self-pay

## 2018-04-29 ENCOUNTER — Encounter (HOSPITAL_COMMUNITY)
Admission: RE | Admit: 2018-04-29 | Discharge: 2018-04-29 | Disposition: A | Discharge status: Home / Self Care | Payer: Medicare Other | Source: Ambulatory Visit | Attending: Cardiothoracic Surgery | Admitting: Cardiothoracic Surgery

## 2018-04-29 DIAGNOSIS — I10 Essential (primary) hypertension: Secondary | ICD-10-CM | POA: Insufficient documentation

## 2018-04-29 DIAGNOSIS — Z01812 Encounter for preprocedural laboratory examination: Secondary | ICD-10-CM | POA: Insufficient documentation

## 2018-04-29 DIAGNOSIS — C342 Malignant neoplasm of middle lobe, bronchus or lung: Secondary | ICD-10-CM | POA: Insufficient documentation

## 2018-04-29 DIAGNOSIS — Z01818 Encounter for other preprocedural examination: Secondary | ICD-10-CM | POA: Diagnosis not present

## 2018-04-29 DIAGNOSIS — Z79899 Other long term (current) drug therapy: Secondary | ICD-10-CM | POA: Insufficient documentation

## 2018-04-29 DIAGNOSIS — Z90722 Acquired absence of ovaries, bilateral: Secondary | ICD-10-CM | POA: Diagnosis not present

## 2018-04-29 DIAGNOSIS — Z0183 Encounter for blood typing: Secondary | ICD-10-CM | POA: Diagnosis not present

## 2018-04-29 DIAGNOSIS — M169 Osteoarthritis of hip, unspecified: Secondary | ICD-10-CM | POA: Insufficient documentation

## 2018-04-29 DIAGNOSIS — E559 Vitamin D deficiency, unspecified: Secondary | ICD-10-CM | POA: Diagnosis not present

## 2018-04-29 DIAGNOSIS — Z79890 Hormone replacement therapy (postmenopausal): Secondary | ICD-10-CM | POA: Insufficient documentation

## 2018-04-29 DIAGNOSIS — Z7951 Long term (current) use of inhaled steroids: Secondary | ICD-10-CM | POA: Diagnosis not present

## 2018-04-29 DIAGNOSIS — E039 Hypothyroidism, unspecified: Secondary | ICD-10-CM | POA: Insufficient documentation

## 2018-04-29 DIAGNOSIS — Z7982 Long term (current) use of aspirin: Secondary | ICD-10-CM | POA: Insufficient documentation

## 2018-04-29 DIAGNOSIS — K219 Gastro-esophageal reflux disease without esophagitis: Secondary | ICD-10-CM | POA: Diagnosis not present

## 2018-04-29 DIAGNOSIS — E785 Hyperlipidemia, unspecified: Secondary | ICD-10-CM | POA: Insufficient documentation

## 2018-04-29 DIAGNOSIS — Z90710 Acquired absence of both cervix and uterus: Secondary | ICD-10-CM | POA: Diagnosis not present

## 2018-04-29 DIAGNOSIS — G4733 Obstructive sleep apnea (adult) (pediatric): Secondary | ICD-10-CM | POA: Insufficient documentation

## 2018-04-29 LAB — CBC
HCT: 39.9 % (ref 36.0–46.0)
Hemoglobin: 13.5 g/dL (ref 12.0–15.0)
MCH: 31.7 pg (ref 26.0–34.0)
MCHC: 33.8 g/dL (ref 30.0–36.0)
MCV: 93.7 fL (ref 78.0–100.0)
Platelets: 198 10*3/uL (ref 150–400)
RBC: 4.26 MIL/uL (ref 3.87–5.11)
RDW: 14.2 % (ref 11.5–15.5)
WBC: 5.2 10*3/uL (ref 4.0–10.5)

## 2018-04-29 LAB — COMPREHENSIVE METABOLIC PANEL
ALT: 14 U/L (ref 14–54)
AST: 24 U/L (ref 15–41)
Albumin: 3.4 g/dL — ABNORMAL LOW (ref 3.5–5.0)
Alkaline Phosphatase: 55 U/L (ref 38–126)
Anion gap: 8 (ref 5–15)
BUN: 14 mg/dL (ref 6–20)
CO2: 24 mmol/L (ref 22–32)
Calcium: 9.3 mg/dL (ref 8.9–10.3)
Chloride: 110 mmol/L (ref 101–111)
Creatinine, Ser: 0.79 mg/dL (ref 0.44–1.00)
GFR calc Af Amer: >60 mL/min (ref >60)
GFR calc non Af Amer: >60 mL/min (ref >60)
Glucose, Bld: 90 mg/dL (ref 65–99)
Potassium: 4.4 mmol/L (ref 3.5–5.1)
Sodium: 142 mmol/L (ref 135–145)
Total Bilirubin: 0.8 mg/dL (ref 0.3–1.2)
Total Protein: 6.6 g/dL (ref 6.5–8.1)

## 2018-04-29 LAB — URINALYSIS, ROUTINE W REFLEX MICROSCOPIC
Bilirubin Urine: NEGATIVE
Glucose, UA: NEGATIVE mg/dL
Ketones, ur: NEGATIVE mg/dL
Leukocytes, UA: NEGATIVE
Nitrite: NEGATIVE
Protein, ur: NEGATIVE mg/dL
Specific Gravity, Urine: 1.019 (ref 1.005–1.030)
pH: 6 (ref 5.0–8.0)

## 2018-04-29 LAB — BLOOD GAS, ARTERIAL
Acid-Base Excess: 1.8 mmol/L (ref 0.0–2.0)
Bicarbonate: 24.8 mmol/L (ref 20.0–28.0)
Drawn by: 421801
FIO2: 21
O2 Saturation: 96.8 %
Patient temperature: 98.6
pCO2 arterial: 32.6 mmHg (ref 32.0–48.0)
pH, Arterial: 7.494 — ABNORMAL HIGH (ref 7.350–7.450)
pO2, Arterial: 119 mmHg — ABNORMAL HIGH (ref 83.0–108.0)

## 2018-04-29 LAB — PROTIME-INR
INR: 0.96
Prothrombin Time: 12.7 seconds (ref 11.4–15.2)

## 2018-04-29 LAB — SURGICAL PCR SCREEN
MRSA, PCR: NEGATIVE
Staphylococcus aureus: NEGATIVE

## 2018-04-29 LAB — TYPE AND SCREEN
ABO/RH(D): O POS
Antibody Screen: NEGATIVE

## 2018-04-29 LAB — ABO/RH: ABO/RH(D): O POS

## 2018-04-29 LAB — APTT: aPTT: 29 seconds (ref 24–36)

## 2018-04-29 NOTE — Progress Notes (Signed)
PCP is Dr Cathlean Cower Denies seeing a cardiologist Denies chest pain, cough, or fever. States she no longer wears a CPAP.  Denies ever having a card cath or stress test. Echo noted 08-21-17

## 2018-04-30 ENCOUNTER — Telehealth: Payer: Self-pay | Admitting: *Deleted
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
## 2018-04-30 NOTE — Telephone Encounter (Signed)
Oncology Nurse Navigator Documentation  Oncology Nurse Navigator Flowsheets 04/30/2018  Navigator Location [facility]  Navigator Encounter Type Telephone/I called to check on [name] to see if she had any questions or concerns before her surgery on 05/03/18.  She states no questions or concerns at this time.  I wished her good luck with surgery and to call if needed.   Telephone Outgoing Call  Abnormal Finding Date 03/04/2018  Confirmed Diagnosis Date 03/31/2018  Surgery Date 05/03/2018  Multidisiplinary Clinic Date 04/15/2018  Treatment Initiated Date 05/03/2018  Treatment Phase Pre-Tx/Tx Discussion  Barriers/Navigation Needs Education  Education Understanding Cancer/ Treatment Options;Other  Interventions Education  Education Method Verbal  Acuity Level 1  Time Spent with Patient 15

## 2018-05-02 NOTE — Anesthesia Preprocedure Evaluation (Addendum)
Anesthesia Evaluation  Patient identified by MRN, date of birth, ID band Patient awake    Reviewed: Allergy & Precautions, NPO status , Patient's Chart, lab work & pertinent test results  Airway Mallampati: II  TM Distance: >3 FB Neck ROM: Full    Dental  (+) Dental Advisory Given   Pulmonary sleep apnea , former smoker,  Right lung cancer  '19 PFT - FVC-Pre L 1.97  FVC-%Pred-Pre % 133  FVC-Post L 1.96  FVC-%Pred-Post % 132  FVC-%Change-Post % 0  FEV1-Pre L 1.34  FEV1-%Pred-Pre % 118  FEV1-Post L 1.52  FEV1-%Pred-Post % 134  FEV1-%Change-Post % 13  FEV6-Pre L 1.97  FEV6-%Pred-Pre % 141  FEV6-Post L 1.96  FEV6-%Pred-Post % 141  FEV6-%Change-Post % 0  Pre FEV1/FVC ratio % 68  FEV1FVC-%Pred-Pre % 90  Post FEV1/FVC ratio % 78  FEV1FVC-%Change-Post % 14  Pre FEV6/FVC Ratio % 100  FEV6FVC-%Pred-Pre % 106  Post FEV6/FVC ratio % 100  FEV6FVC-%Pred-Post % 106  FEF 25-75 Pre L/sec 0.71  FEF2575-%Pred-Pre % 76  FEF 25-75 Post L/sec 1.28  FEF2575-%Pred-Post % 137  FEF2575-%Change-Post % 80  RV L 1.59  RV % pred % 70  TLC L 3.61  TLC % pred % 81  DLCO unc ml/min/mmHg 16.44  DLCO unc % pred % 87  DL/VA ml/min/mmHg/L 4.98  DL/VA % pred % 117      breath sounds clear to auscultation       Cardiovascular hypertension, Pt. on medications + Peripheral Vascular Disease   Rhythm:Regular Rate:Normal  '19 EKG - NSR, lateral TWI  '18 TTE - Moderate focal basal and mild concentric hypertrophy of the remaining myocardium. EF 65% to 70%. Grade 1 diastolic dysfunction. Mild AI, MR, and TR.    Neuro/Psych Anxiety negative neurological ROS     GI/Hepatic Neg liver ROS, GERD  Medicated and Controlled,  Endo/Other  Hypothyroidism   Renal/GU negative Renal ROS  negative genitourinary   Musculoskeletal  (+) Arthritis ,   Abdominal   Peds  Hematology negative hematology ROS (+)   Anesthesia Other Findings    Reproductive/Obstetrics                           Anesthesia Physical Anesthesia Plan  ASA: III  Anesthesia Plan: General   Post-op Pain Management:    Induction: Intravenous  PONV Risk Score and Plan: 3 and Treatment may vary due to age or medical condition, Ondansetron and Dexamethasone  Airway Management Planned: Double Lumen EBT  Additional Equipment: Arterial line, CVP and Ultrasound Guidance Line Placement  Intra-op Plan:   Post-operative Plan: Possible Post-op intubation/ventilation  Informed Consent: I have reviewed the patients History and Physical, chart, labs and discussed the procedure including the risks, benefits and alternatives for the proposed anesthesia with the patient or authorized representative who has indicated his/her understanding and acceptance.   Dental advisory given  Plan Discussed with: CRNA and Anesthesiologist  Anesthesia Plan Comments:         Anesthesia Quick Evaluation

## 2018-05-03 ENCOUNTER — Inpatient Hospital Stay (HOSPITAL_COMMUNITY): Payer: Medicare Other | Admitting: Emergency Medicine

## 2018-05-03 ENCOUNTER — Inpatient Hospital Stay (HOSPITAL_COMMUNITY)
Admission: RE | Admit: 2018-05-03 | Discharge: 2018-05-09 | DRG: 164 | Disposition: A | Discharge status: Home Health | Payer: Medicare Other | Source: Ambulatory Visit | Attending: Cardiothoracic Surgery | Admitting: Cardiothoracic Surgery

## 2018-05-03 ENCOUNTER — Inpatient Hospital Stay (HOSPITAL_COMMUNITY): Payer: Medicare Other

## 2018-05-03 ENCOUNTER — Inpatient Hospital Stay (HOSPITAL_COMMUNITY): Payer: Medicare Other | Admitting: Anesthesiology

## 2018-05-03 ENCOUNTER — Encounter (HOSPITAL_COMMUNITY)
Admission: RE | Disposition: A | Discharge status: Home Health | Payer: Self-pay | Source: Ambulatory Visit | Attending: Cardiothoracic Surgery

## 2018-05-03 ENCOUNTER — Other Ambulatory Visit: Payer: Self-pay

## 2018-05-03 ENCOUNTER — Encounter (HOSPITAL_COMMUNITY): Payer: Self-pay | Admitting: Certified Registered"

## 2018-05-03 DIAGNOSIS — Z87891 Personal history of nicotine dependence: Secondary | ICD-10-CM

## 2018-05-03 DIAGNOSIS — I10 Essential (primary) hypertension: Secondary | ICD-10-CM | POA: Diagnosis present

## 2018-05-03 DIAGNOSIS — C342 Malignant neoplasm of middle lobe, bronchus or lung: Secondary | ICD-10-CM | POA: Diagnosis not present

## 2018-05-03 DIAGNOSIS — Y92230 Patient room in hospital as the place of occurrence of the external cause: Secondary | ICD-10-CM | POA: Diagnosis not present

## 2018-05-03 DIAGNOSIS — Z801 Family history of malignant neoplasm of trachea, bronchus and lung: Secondary | ICD-10-CM

## 2018-05-03 DIAGNOSIS — D62 Acute posthemorrhagic anemia: Secondary | ICD-10-CM | POA: Diagnosis not present

## 2018-05-03 DIAGNOSIS — E039 Hypothyroidism, unspecified: Secondary | ICD-10-CM | POA: Diagnosis present

## 2018-05-03 DIAGNOSIS — J9382 Other air leak: Secondary | ICD-10-CM | POA: Diagnosis not present

## 2018-05-03 DIAGNOSIS — G4733 Obstructive sleep apnea (adult) (pediatric): Secondary | ICD-10-CM | POA: Diagnosis present

## 2018-05-03 DIAGNOSIS — K219 Gastro-esophageal reflux disease without esophagitis: Secondary | ICD-10-CM | POA: Diagnosis present

## 2018-05-03 DIAGNOSIS — Z90710 Acquired absence of both cervix and uterus: Secondary | ICD-10-CM

## 2018-05-03 DIAGNOSIS — S0990XA Unspecified injury of head, initial encounter: Secondary | ICD-10-CM | POA: Diagnosis not present

## 2018-05-03 DIAGNOSIS — Z4682 Encounter for fitting and adjustment of non-vascular catheter: Secondary | ICD-10-CM | POA: Diagnosis not present

## 2018-05-03 DIAGNOSIS — Z90722 Acquired absence of ovaries, bilateral: Secondary | ICD-10-CM

## 2018-05-03 DIAGNOSIS — J984 Other disorders of lung: Secondary | ICD-10-CM | POA: Diagnosis not present

## 2018-05-03 DIAGNOSIS — Z881 Allergy status to other antibiotic agents status: Secondary | ICD-10-CM | POA: Diagnosis not present

## 2018-05-03 DIAGNOSIS — J939 Pneumothorax, unspecified: Secondary | ICD-10-CM | POA: Diagnosis not present

## 2018-05-03 DIAGNOSIS — W010XXA Fall on same level from slipping, tripping and stumbling without subsequent striking against object, initial encounter: Secondary | ICD-10-CM | POA: Diagnosis not present

## 2018-05-03 DIAGNOSIS — E785 Hyperlipidemia, unspecified: Secondary | ICD-10-CM | POA: Diagnosis present

## 2018-05-03 DIAGNOSIS — Z09 Encounter for follow-up examination after completed treatment for conditions other than malignant neoplasm: Secondary | ICD-10-CM

## 2018-05-03 DIAGNOSIS — J9811 Atelectasis: Secondary | ICD-10-CM | POA: Diagnosis not present

## 2018-05-03 DIAGNOSIS — I517 Cardiomegaly: Secondary | ICD-10-CM | POA: Diagnosis not present

## 2018-05-03 DIAGNOSIS — J90 Pleural effusion, not elsewhere classified: Secondary | ICD-10-CM | POA: Diagnosis not present

## 2018-05-03 DIAGNOSIS — Z9689 Presence of other specified functional implants: Secondary | ICD-10-CM

## 2018-05-03 DIAGNOSIS — R911 Solitary pulmonary nodule: Secondary | ICD-10-CM | POA: Diagnosis present

## 2018-05-03 HISTORY — PX: VIDEO BRONCHOSCOPY: SHX5072

## 2018-05-03 HISTORY — PX: LOBECTOMY: SHX5089

## 2018-05-03 HISTORY — PX: VIDEO ASSISTED THORACOSCOPY (VATS)/WEDGE RESECTION: SHX6174

## 2018-05-03 HISTORY — PX: NODE DISSECTION: SHX5269

## 2018-05-03 LAB — GLUCOSE, CAPILLARY
Glucose-Capillary: 122 mg/dL — ABNORMAL HIGH (ref 65–99)
Glucose-Capillary: 123 mg/dL — ABNORMAL HIGH (ref 65–99)

## 2018-05-03 SURGERY — BRONCHOSCOPY, VIDEO-ASSISTED
Anesthesia: General | Site: Chest | Laterality: Right

## 2018-05-03 MED ORDER — ACETAMINOPHEN 160 MG/5ML PO SOLN: 1000.0000 mg | Freq: Four times a day (QID) | ORAL | Status: AC

## 2018-05-03 MED ORDER — SUGAMMADEX SODIUM 200 MG/2ML IV SOLN
INTRAVENOUS | Status: DC | PRN
  Administered 2018-05-03: 200 mg via INTRAVENOUS

## 2018-05-03 MED ORDER — LIDOCAINE HCL (CARDIAC) PF 100 MG/5ML IV SOSY
PREFILLED_SYRINGE | INTRAVENOUS | Status: DC | PRN
  Administered 2018-05-03: 100 mg via INTRAVENOUS

## 2018-05-03 MED ORDER — FENTANYL CITRATE (PF) 100 MCG/2ML IJ SOLN
25.0000 ug | INTRAMUSCULAR | Status: DC | PRN
  Administered 2018-05-03: 25 ug via INTRAVENOUS

## 2018-05-03 MED ORDER — FENTANYL CITRATE (PF) 100 MCG/2ML IJ SOLN
INTRAMUSCULAR | Status: AC
  Filled 2018-05-03: qty 2

## 2018-05-03 MED ORDER — ONDANSETRON HCL 4 MG/2ML IJ SOLN
INTRAMUSCULAR | Status: AC
  Filled 2018-05-03: qty 2

## 2018-05-03 MED ORDER — EPHEDRINE SULFATE 50 MG/ML IJ SOLN
INTRAMUSCULAR | Status: DC | PRN
  Administered 2018-05-03: 5 mg via INTRAVENOUS

## 2018-05-03 MED ORDER — BUPIVACAINE HCL 0.5 % IJ SOLN
INTRAMUSCULAR | Status: DC | PRN
  Administered 2018-05-03: 5 mL

## 2018-05-03 MED ORDER — HEMOSTATIC AGENTS (NO CHARGE) OPTIME
TOPICAL | Status: DC | PRN
  Administered 2018-05-03: 1 via TOPICAL

## 2018-05-03 MED ORDER — MECLIZINE HCL 12.5 MG PO TABS
12.5000 mg | ORAL_TABLET | Freq: Four times a day (QID) | ORAL | Status: DC | PRN
  Administered 2018-05-06: 25 mg via ORAL
  Filled 2018-05-03 (×2): qty 2

## 2018-05-03 MED ORDER — FENTANYL CITRATE (PF) 100 MCG/2ML IJ SOLN
INTRAMUSCULAR | Status: DC | PRN
  Administered 2018-05-03: 25 ug via INTRAVENOUS
  Administered 2018-05-03: 50 ug via INTRAVENOUS
  Administered 2018-05-03 (×2): 25 ug via INTRAVENOUS
  Administered 2018-05-03: 100 ug via INTRAVENOUS

## 2018-05-03 MED ORDER — ONDANSETRON HCL 4 MG/2ML IJ SOLN: 4.0000 mg | Freq: Once | INTRAMUSCULAR | Status: DC | PRN

## 2018-05-03 MED ORDER — SENNOSIDES-DOCUSATE SODIUM 8.6-50 MG PO TABS
1.0000 | ORAL_TABLET | Freq: Every day | ORAL | Status: DC
  Administered 2018-05-03 – 2018-05-08 (×2): 1 via ORAL
  Filled 2018-05-03 (×3): qty 1

## 2018-05-03 MED ORDER — DEXAMETHASONE SODIUM PHOSPHATE 10 MG/ML IJ SOLN
INTRAMUSCULAR | Status: DC | PRN
  Administered 2018-05-03: 10 mg via INTRAVENOUS

## 2018-05-03 MED ORDER — DIPHENHYDRAMINE HCL 50 MG/ML IJ SOLN: 12.5000 mg | Freq: Four times a day (QID) | INTRAMUSCULAR | Status: DC | PRN

## 2018-05-03 MED ORDER — DIPHENHYDRAMINE HCL 12.5 MG/5ML PO ELIX
12.5000 mg | ORAL_SOLUTION | Freq: Four times a day (QID) | ORAL | Status: DC | PRN
  Filled 2018-05-03: qty 5

## 2018-05-03 MED ORDER — PROPOFOL 10 MG/ML IV BOLUS
INTRAVENOUS | Status: DC | PRN
  Administered 2018-05-03 (×2): 100 mg via INTRAVENOUS

## 2018-05-03 MED ORDER — MONTELUKAST SODIUM 10 MG PO TABS
10.0000 mg | ORAL_TABLET | Freq: Every day | ORAL | Status: DC
  Administered 2018-05-04 – 2018-05-09 (×6): 10 mg via ORAL
  Filled 2018-05-03 (×6): qty 1

## 2018-05-03 MED ORDER — ROCURONIUM BROMIDE 50 MG/5ML IV SOLN
INTRAVENOUS | Status: AC
  Filled 2018-05-03: qty 2

## 2018-05-03 MED ORDER — CEFAZOLIN SODIUM-DEXTROSE 2-4 GM/100ML-% IV SOLN
2.0000 g | Freq: Three times a day (TID) | INTRAVENOUS | Status: AC
  Administered 2018-05-04: 2 g via INTRAVENOUS
  Filled 2018-05-03 (×2): qty 100

## 2018-05-03 MED ORDER — LACTATED RINGERS IV SOLN
INTRAVENOUS | Status: DC | PRN
  Administered 2018-05-03: 07:00:00 via INTRAVENOUS

## 2018-05-03 MED ORDER — ONDANSETRON HCL 4 MG/2ML IJ SOLN
INTRAMUSCULAR | Status: DC | PRN
  Administered 2018-05-03: 4 mg via INTRAVENOUS

## 2018-05-03 MED ORDER — PROPOFOL 10 MG/ML IV BOLUS
INTRAVENOUS | Status: AC
  Filled 2018-05-03: qty 20

## 2018-05-03 MED ORDER — OXYCODONE HCL 5 MG/5ML PO SOLN: 5.0000 mg | Freq: Once | ORAL | Status: DC | PRN

## 2018-05-03 MED ORDER — NALOXONE HCL 0.4 MG/ML IJ SOLN: 0.4000 mg | INTRAMUSCULAR | Status: DC | PRN

## 2018-05-03 MED ORDER — HYDRALAZINE HCL 20 MG/ML IJ SOLN
10.0000 mg | INTRAMUSCULAR | Status: DC | PRN
  Administered 2018-05-03 (×2): 10 mg via INTRAVENOUS

## 2018-05-03 MED ORDER — BUPIVACAINE 0.5 % ON-Q PUMP SINGLE CATH 400 ML
400.0000 mL | INJECTION | Status: AC
  Administered 2018-05-03: 400 mL
  Filled 2018-05-03: qty 400

## 2018-05-03 MED ORDER — ESTROGENS CONJUGATED 0.3 MG PO TABS: 0.3000 mg | ORAL_TABLET | Freq: Every day | ORAL | Status: DC

## 2018-05-03 MED ORDER — CEFAZOLIN SODIUM-DEXTROSE 2-4 GM/100ML-% IV SOLN
INTRAVENOUS | Status: AC
  Filled 2018-05-03: qty 100

## 2018-05-03 MED ORDER — ACETAMINOPHEN 500 MG PO TABS
1000.0000 mg | ORAL_TABLET | Freq: Four times a day (QID) | ORAL | Status: AC
  Administered 2018-05-04 – 2018-05-08 (×8): 1000 mg via ORAL
  Filled 2018-05-03 (×9): qty 2

## 2018-05-03 MED ORDER — ATORVASTATIN CALCIUM 40 MG PO TABS
40.0000 mg | ORAL_TABLET | Freq: Every day | ORAL | Status: DC
  Administered 2018-05-04 – 2018-05-09 (×6): 40 mg via ORAL
  Filled 2018-05-03 (×6): qty 1

## 2018-05-03 MED ORDER — OXYCODONE HCL 5 MG PO TABS
5.0000 mg | ORAL_TABLET | ORAL | Status: DC | PRN
  Administered 2018-05-04: 10 mg via ORAL
  Filled 2018-05-03: qty 2

## 2018-05-03 MED ORDER — ESMOLOL HCL 100 MG/10ML IV SOLN
INTRAVENOUS | Status: DC | PRN
  Administered 2018-05-03: 20 mg via INTRAVENOUS

## 2018-05-03 MED ORDER — BUPIVACAINE ON-Q PAIN PUMP (FOR ORDER SET NO CHG)
INJECTION | Status: AC
  Filled 2018-05-03: qty 1

## 2018-05-03 MED ORDER — INSULIN ASPART 100 UNIT/ML ~~LOC~~ SOLN
0.0000 [IU] | SUBCUTANEOUS | Status: DC
  Administered 2018-05-03 – 2018-05-07 (×8): 2 [IU] via SUBCUTANEOUS

## 2018-05-03 MED ORDER — TRAMADOL HCL 50 MG PO TABS: 50.0000 mg | ORAL_TABLET | Freq: Four times a day (QID) | ORAL | Status: DC | PRN

## 2018-05-03 MED ORDER — BISACODYL 5 MG PO TBEC
10.0000 mg | DELAYED_RELEASE_TABLET | Freq: Every day | ORAL | Status: DC
  Administered 2018-05-04 – 2018-05-09 (×5): 10 mg via ORAL
  Filled 2018-05-03 (×6): qty 2

## 2018-05-03 MED ORDER — POTASSIUM CHLORIDE 10 MEQ/50ML IV SOLN: 10.0000 meq | Freq: Every day | INTRAVENOUS | Status: DC | PRN

## 2018-05-03 MED ORDER — SUGAMMADEX SODIUM 200 MG/2ML IV SOLN
INTRAVENOUS | Status: AC
  Filled 2018-05-03: qty 2

## 2018-05-03 MED ORDER — FENTANYL CITRATE (PF) 250 MCG/5ML IJ SOLN
INTRAMUSCULAR | Status: AC
  Filled 2018-05-03: qty 5

## 2018-05-03 MED ORDER — HYDRALAZINE HCL 20 MG/ML IJ SOLN
INTRAMUSCULAR | Status: AC
  Filled 2018-05-03: qty 1

## 2018-05-03 MED ORDER — OXYCODONE HCL 5 MG PO TABS: 5.0000 mg | ORAL_TABLET | Freq: Once | ORAL | Status: DC | PRN

## 2018-05-03 MED ORDER — 0.9 % SODIUM CHLORIDE (POUR BTL) OPTIME
TOPICAL | Status: DC | PRN
  Administered 2018-05-03: 3000 mL

## 2018-05-03 MED ORDER — DEXTROSE-NACL 5-0.45 % IV SOLN
INTRAVENOUS | Status: DC
  Administered 2018-05-03 – 2018-05-04 (×2): via INTRAVENOUS

## 2018-05-03 MED ORDER — FENTANYL CITRATE (PF) 100 MCG/2ML IJ SOLN: 25.0000 ug | INTRAMUSCULAR | Status: DC | PRN

## 2018-05-03 MED ORDER — ONDANSETRON HCL 4 MG/2ML IJ SOLN
4.0000 mg | Freq: Four times a day (QID) | INTRAMUSCULAR | Status: DC | PRN
  Administered 2018-05-04 (×2): 4 mg via INTRAVENOUS
  Filled 2018-05-03 (×2): qty 2

## 2018-05-03 MED ORDER — DEXAMETHASONE SODIUM PHOSPHATE 10 MG/ML IJ SOLN
INTRAMUSCULAR | Status: AC
  Filled 2018-05-03: qty 1

## 2018-05-03 MED ORDER — ESMOLOL HCL 100 MG/10ML IV SOLN
INTRAVENOUS | Status: AC
  Filled 2018-05-03: qty 10

## 2018-05-03 MED ORDER — EPHEDRINE SULFATE 50 MG/ML IJ SOLN
INTRAMUSCULAR | Status: AC
  Filled 2018-05-03: qty 1

## 2018-05-03 MED ORDER — LEVOTHYROXINE SODIUM 75 MCG PO TABS
75.0000 ug | ORAL_TABLET | Freq: Every day | ORAL | Status: DC
  Administered 2018-05-04 – 2018-05-09 (×6): 75 ug via ORAL
  Filled 2018-05-03 (×7): qty 1

## 2018-05-03 MED ORDER — FENTANYL 40 MCG/ML IV SOLN
INTRAVENOUS | Status: DC
  Administered 2018-05-03: 20 ug via INTRAVENOUS
  Administered 2018-05-03: 1000 ug via INTRAVENOUS
  Administered 2018-05-04 (×2): 0 ug via INTRAVENOUS
  Administered 2018-05-04: 10 ug via INTRAVENOUS
  Administered 2018-05-04 – 2018-05-05 (×3): 0 ug via INTRAVENOUS
  Filled 2018-05-03 (×2): qty 25

## 2018-05-03 MED ORDER — BUPIVACAINE HCL (PF) 0.5 % IJ SOLN
INTRAMUSCULAR | Status: AC
  Filled 2018-05-03: qty 10

## 2018-05-03 MED ORDER — FLUTICASONE PROPIONATE 50 MCG/ACT NA SUSP
1.0000 | Freq: Every day | NASAL | Status: DC | PRN
  Filled 2018-05-03: qty 16

## 2018-05-03 MED ORDER — ROCURONIUM BROMIDE 100 MG/10ML IV SOLN
INTRAVENOUS | Status: DC | PRN
  Administered 2018-05-03: 40 mg via INTRAVENOUS
  Administered 2018-05-03 (×2): 10 mg via INTRAVENOUS

## 2018-05-03 MED ORDER — MIDAZOLAM HCL 2 MG/2ML IJ SOLN
INTRAMUSCULAR | Status: AC
  Filled 2018-05-03: qty 2

## 2018-05-03 MED ORDER — SODIUM CHLORIDE 0.9% FLUSH
9.0000 mL | INTRAVENOUS | Status: DC | PRN
  Administered 2018-05-04: 10 mL via INTRAVENOUS
  Filled 2018-05-03: qty 9

## 2018-05-03 MED ORDER — CEFAZOLIN SODIUM-DEXTROSE 2-4 GM/100ML-% IV SOLN
2.0000 g | INTRAVENOUS | Status: AC
  Administered 2018-05-03: 2 g via INTRAVENOUS

## 2018-05-03 SURGICAL SUPPLY — 95 items
APPLICATOR TIP COSEAL (VASCULAR PRODUCTS) IMPLANT
APPLICATOR TIP EXT COSEAL (VASCULAR PRODUCTS) IMPLANT
BLADE SURG 11 STRL SS (BLADE) IMPLANT
BRUSH CYTOL CELLEBRITY 1.5X140 (MISCELLANEOUS) IMPLANT
CANISTER SUCT 3000ML PPV (MISCELLANEOUS) ×3 IMPLANT
CATH KIT ON Q 5IN SLV (PAIN MANAGEMENT) IMPLANT
CATH KIT ON-Q SILVERSOAK 5IN (CATHETERS) ×3 IMPLANT
CATH THORACIC 28FR (CATHETERS) ×3 IMPLANT
CATH THORACIC 36FR (CATHETERS) IMPLANT
CATH THORACIC 36FR RT ANG (CATHETERS) IMPLANT
CLIP VESOCCLUDE MED 6/CT (CLIP) ×3 IMPLANT
CONN ST 1/4X3/8  BEN (MISCELLANEOUS) ×1
CONN ST 1/4X3/8 BEN (MISCELLANEOUS) ×2 IMPLANT
CONT SPEC 4OZ CLIKSEAL STRL BL (MISCELLANEOUS) ×24 IMPLANT
COVER BACK TABLE 60X90IN (DRAPES) ×3 IMPLANT
CUTTER ECHEON FLEX ENDO 45 340 (ENDOMECHANICALS) ×3 IMPLANT
DERMABOND ADVANCED (GAUZE/BANDAGES/DRESSINGS) ×1
DERMABOND ADVANCED .7 DNX12 (GAUZE/BANDAGES/DRESSINGS) ×2 IMPLANT
DRAIN CHANNEL 28F RND 3/8 FF (WOUND CARE) ×6 IMPLANT
DRAIN CHANNEL 32F RND 10.7 FF (WOUND CARE) IMPLANT
DRAPE LAPAROSCOPIC ABDOMINAL (DRAPES) ×3 IMPLANT
DRAPE WARM FLUID 44X44 (DRAPE) ×3 IMPLANT
DRILL BIT 7/64X5 (BIT) IMPLANT
ELECT BLADE 4.0 EZ CLEAN MEGAD (MISCELLANEOUS) ×3
ELECT BLADE 6.5 EXT (BLADE) ×3 IMPLANT
ELECT REM PT RETURN 9FT ADLT (ELECTROSURGICAL) ×3
ELECTRODE BLDE 4.0 EZ CLN MEGD (MISCELLANEOUS) ×2 IMPLANT
ELECTRODE REM PT RTRN 9FT ADLT (ELECTROSURGICAL) ×2 IMPLANT
FORCEPS BIOP RJ4 1.8 (CUTTING FORCEPS) IMPLANT
GAUZE SPONGE 4X4 12PLY STRL (GAUZE/BANDAGES/DRESSINGS) ×3 IMPLANT
GLOVE BIO SURGEON STRL SZ 6.5 (GLOVE) ×9 IMPLANT
GLOVE SURG SS PI 7.0 STRL IVOR (GLOVE) ×3 IMPLANT
GOWN STRL REUS W/ TWL LRG LVL3 (GOWN DISPOSABLE) ×8 IMPLANT
GOWN STRL REUS W/TWL LRG LVL3 (GOWN DISPOSABLE) ×4
HEMOSTAT SURGICEL 2X14 (HEMOSTASIS) ×3 IMPLANT
KIT BASIN OR (CUSTOM PROCEDURE TRAY) ×3 IMPLANT
KIT CLEAN ENDO COMPLIANCE (KITS) ×3 IMPLANT
KIT SUCTION CATH 14FR (SUCTIONS) ×6 IMPLANT
KIT TURNOVER KIT B (KITS) ×3 IMPLANT
LOOP VESSEL MINI RED (MISCELLANEOUS) ×3 IMPLANT
MARKER SKIN DUAL TIP RULER LAB (MISCELLANEOUS) IMPLANT
NS IRRIG 1000ML POUR BTL (IV SOLUTION) ×6 IMPLANT
OIL SILICONE PENTAX (PARTS (SERVICE/REPAIRS)) ×3 IMPLANT
PACK CHEST (CUSTOM PROCEDURE TRAY) ×3 IMPLANT
PAD ARMBOARD 7.5X6 YLW CONV (MISCELLANEOUS) ×9 IMPLANT
PASSER SUT SWANSON 36MM LOOP (INSTRUMENTS) IMPLANT
POUCH SPECIMEN RETRIEVAL 10MM (ENDOMECHANICALS) ×3 IMPLANT
PROGEL SPRAY TIP 11IN (MISCELLANEOUS) ×3
SCISSORS LAP 5X35 DISP (ENDOMECHANICALS) IMPLANT
SEALANT PROGEL (MISCELLANEOUS) ×3 IMPLANT
SEALANT SURG COSEAL 4ML (VASCULAR PRODUCTS) IMPLANT
SEALANT SURG COSEAL 8ML (VASCULAR PRODUCTS) IMPLANT
SOLUTION ANTI FOG 6CC (MISCELLANEOUS) ×3 IMPLANT
SPONGE TONSIL 1 RF SGL (DISPOSABLE) ×6 IMPLANT
STAPLE RELOAD 2.5MM WHITE (STAPLE) ×12 IMPLANT
STAPLE RELOAD 45 GRN (STAPLE) ×2 IMPLANT
STAPLE RELOAD 45MM GOLD (STAPLE) ×12 IMPLANT
STAPLE RELOAD 45MM GREEN (STAPLE) ×1
STAPLER VASCULAR ECHELON 35 (CUTTER) ×3 IMPLANT
SUT PROLENE 3 0 SH DA (SUTURE) IMPLANT
SUT PROLENE 4 0 RB 1 (SUTURE)
SUT PROLENE 4-0 RB1 .5 CRCL 36 (SUTURE) IMPLANT
SUT SILK  1 MH (SUTURE) ×5
SUT SILK 1 MH (SUTURE) ×10 IMPLANT
SUT SILK 1 TIES 10X30 (SUTURE) IMPLANT
SUT SILK 2 0 SH (SUTURE) ×6 IMPLANT
SUT SILK 2 0SH CR/8 30 (SUTURE) IMPLANT
SUT SILK 3 0SH CR/8 30 (SUTURE) IMPLANT
SUT STEEL 1 (SUTURE) IMPLANT
SUT VIC AB 0 CTX 18 (SUTURE) IMPLANT
SUT VIC AB 1 CTX 18 (SUTURE) ×3 IMPLANT
SUT VIC AB 1 CTX 36 (SUTURE)
SUT VIC AB 1 CTX36XBRD ANBCTR (SUTURE) IMPLANT
SUT VIC AB 2-0 CTX 36 (SUTURE) ×3 IMPLANT
SUT VIC AB 2-0 UR6 27 (SUTURE) IMPLANT
SUT VIC AB 3-0 SH 8-18 (SUTURE) IMPLANT
SUT VIC AB 3-0 X1 27 (SUTURE) ×3 IMPLANT
SUT VICRYL 0 UR6 27IN ABS (SUTURE) IMPLANT
SUT VICRYL 2 TP 1 (SUTURE) ×3 IMPLANT
SYR 20ML ECCENTRIC (SYRINGE) ×3 IMPLANT
SYSTEM SAHARA CHEST DRAIN ATS (WOUND CARE) ×3 IMPLANT
TAPE CLOTH SURG 4X10 WHT LF (GAUZE/BANDAGES/DRESSINGS) ×3 IMPLANT
TAPE UMBILICAL COTTON 1/8X30 (MISCELLANEOUS) ×3 IMPLANT
TIP SPRAY PROGEL 11IN (MISCELLANEOUS) ×2 IMPLANT
TOWEL GREEN STERILE (TOWEL DISPOSABLE) ×3 IMPLANT
TOWEL GREEN STERILE FF (TOWEL DISPOSABLE) ×3 IMPLANT
TRAP SPECIMEN MUCOUS 40CC (MISCELLANEOUS) ×3 IMPLANT
TRAY FOLEY MTR SLVR 16FR STAT (SET/KITS/TRAYS/PACK) ×3 IMPLANT
TROCAR BLADELESS 12MM (ENDOMECHANICALS) ×3 IMPLANT
TROCAR BLADELESS 5M (ENDOMECHANICALS) ×3 IMPLANT
TROCAR XCEL 12X100 BLDLESS (ENDOMECHANICALS) IMPLANT
TUBE CONNECTING 20X1/4 (TUBING) ×6 IMPLANT
TUNNELER SHEATH ON-Q 11GX8 DSP (PAIN MANAGEMENT) ×3 IMPLANT
VALVE DISPOSABLE (MISCELLANEOUS) ×3 IMPLANT
WATER STERILE IRR 1000ML POUR (IV SOLUTION) ×3 IMPLANT

## 2018-05-03 NOTE — Anesthesia Postprocedure Evaluation (Signed)
Anesthesia Post Note  Patient: [name]  Procedure(s) Performed: VIDEO BRONCHOSCOPY (N/A ) VIDEO ASSISTED THORACOSCOPY (VATS)/LUNG RESECTION with insertion of ON-Q pump (Right Chest) NODE DISSECTION (Right Chest) RIGHT MIDDLE LOBECTOMY (Right Chest)     Patient location during evaluation: PACU Anesthesia Type: General Level of consciousness: awake and alert Pain management: pain level controlled Vital Signs Assessment: post-procedure vital signs reviewed and stable Respiratory status: spontaneous breathing, nonlabored ventilation, respiratory function stable and patient connected to nasal cannula oxygen Cardiovascular status: blood pressure returned to baseline and stable Postop Assessment: no apparent nausea or vomiting Anesthetic complications: no    Last Vitals:  Vitals:   05/03/18 1545 05/03/18 1638  BP:  (!) 186/72  Pulse: 70   Resp: 13   Temp:    SpO2: 99%     Last Pain:  Vitals:   05/03/18 1415  TempSrc:   PainSc: [place]

## 2018-05-03 NOTE — Brief Op Note (Addendum)
05/03/2018  3:07 PM  PATIENT:  [name]  84 y.o. female  PRE-OPERATIVE DIAGNOSIS:  LUNG CANCER  POST-OPERATIVE DIAGNOSIS:  LUNG CANCER  PROCEDURE:  Procedure(s): VIDEO BRONCHOSCOPY (N/A) VIDEO ASSISTED THORACOSCOPY (VATS)/LUNG RESECTION with insertion of ON-Q pump (Right) NODE DISSECTION (Right) RIGHT MIDDLE LOBECTOMY (Right)  SURGEON:  Surgeon(s) and Role:    * Grace Isaac, MD - Primary  PHYSICIAN ASSISTANT:  Nicholes Rough, PA-C   ANESTHESIA:   general  EBL:  150 mL   BLOOD ADMINISTERED:none  DRAINS: ONE STRAIGHT CHEST TUBE AND ONE BLAKE DRAIN   LOCAL MEDICATIONS USED:  BUPIVICAINE   SPECIMEN:  Source of Specimen:  RIGHT MIDDLE LOBE  DISPOSITION OF SPECIMEN:  PATHOLOGY  COUNTS:  YES  DICTATION: .Dragon Dictation  PLAN OF CARE: Admit to inpatient   PATIENT DISPOSITION:  ICU - intubated and hemodynamically stable.   Delay start of Pharmacological VTE agent (>24hrs) due to surgical blood loss or risk of bleeding: yes
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
## 2018-05-03 NOTE — Anesthesia Procedure Notes (Signed)
Central Venous Catheter Insertion Performed by: Audry Pili, MD, anesthesiologist Start/End5/05/2018 7:06 AM, 05/03/2018 7:17 AM Patient location: Pre-op. Preanesthetic checklist: patient identified, IV checked, risks and benefits discussed, surgical consent, monitors and equipment checked, pre-op evaluation, timeout performed and anesthesia consent Lidocaine 1% used for infiltration and patient sedated Hand hygiene performed  and maximum sterile barriers used  Catheter size: 8 Fr Total catheter length 16. Central line was placed.Double lumen Procedure performed using ultrasound guided technique. Ultrasound Notes:image(s) printed for medical record Attempts: 1 Following insertion, dressing applied, line sutured and Biopatch. Post procedure assessment: blood return through all ports  Patient tolerated the procedure well with no immediate complications.

## 2018-05-03 NOTE — Transfer of Care (Signed)
Immediate Anesthesia Transfer of Care Note  Patient: Particia Lather  Procedure(s) Performed: VIDEO BRONCHOSCOPY (N/A ) VIDEO ASSISTED THORACOSCOPY (VATS)/LUNG RESECTION with insertion of ON-Q pump (Right Chest) NODE DISSECTION (Right Chest) RIGHT MIDDLE LOBECTOMY (Right Chest)  Patient Location: PACU  Anesthesia Type:General  Level of Consciousness: awake and drowsy  Airway & Oxygen Therapy: Patient Spontanous Breathing and Patient connected to face mask oxygen  Post-op Assessment: Report given to RN, Post -op Vital signs reviewed and stable and Patient moving all extremities X 4  Post vital signs: Reviewed and stable  Last Vitals:  Vitals Value Taken Time  BP 164/90 05/03/2018 12:13 PM  Temp    Pulse 58 05/03/2018 12:17 PM  Resp 16 05/03/2018 12:17 PM  SpO2 97 % 05/03/2018 12:17 PM  Vitals shown include unvalidated device data.  Last Pain:  Vitals:   05/03/18 0631  TempSrc:   PainSc: 0-No pain      Patients Stated Pain Goal: 2 (61/51/83 4373)  Complications: No apparent anesthesia complications

## 2018-05-03 NOTE — Anesthesia Procedure Notes (Signed)
Arterial Line Insertion Start/End5/05/2018 6:55 AM, 05/03/2018 7:10 AM Performed by: Gaylene Brooks, CRNA, CRNA  Patient location: Pre-op. Preanesthetic checklist: patient identified, IV checked, site marked, risks and benefits discussed, surgical consent, monitors and equipment checked, pre-op evaluation, timeout performed and anesthesia consent Lidocaine 1% used for infiltration Left, radial was placed Catheter size: 20 G Hand hygiene performed  and maximum sterile barriers used  Allen's test indicative of satisfactory collateral circulation Attempts: 2 (one per SRNA ) Procedure performed without using ultrasound guided technique. Following insertion, Biopatch. Post procedure assessment: normal  Patient tolerated the procedure well with no immediate complications.

## 2018-05-03 NOTE — Anesthesia Procedure Notes (Signed)
Procedure Name: Intubation Date/Time: 05/03/2018 7:52 AM Performed by: Gaylene Brooks, CRNA Pre-anesthesia Checklist: Patient identified, Emergency Drugs available, Suction available, Patient being monitored and Timeout performed Patient Re-evaluated:Patient Re-evaluated prior to induction Oxygen Delivery Method: Circle system utilized Preoxygenation: Pre-oxygenation with 100% oxygen Induction Type: IV induction Ventilation: Mask ventilation without difficulty Laryngoscope Size: Miller and 2 Grade View: Grade II Tube type: Oral Endobronchial tube: Left, Double lumen EBT, EBT position confirmed by auscultation and EBT position confirmed by fiberoptic bronchoscope and 35 Fr Number of attempts: 2 Airway Equipment and Method: Patient positioned with wedge pillow and Stylet Placement Confirmation: ETT inserted through vocal cords under direct vision,  positive ETCO2 and breath sounds checked- equal and bilateral Secured at: 27 cm Tube secured with: Tape Dental Injury: Teeth and Oropharynx as per pre-operative assessment  Comments: DL x 1 per SRNA with 37Fr, unable to pass, DL x 1 by CRNA and able to pass 35 Fr.

## 2018-05-04 ENCOUNTER — Inpatient Hospital Stay (HOSPITAL_COMMUNITY): Payer: Medicare Other

## 2018-05-04 ENCOUNTER — Other Ambulatory Visit: Payer: Self-pay | Admitting: Internal Medicine

## 2018-05-04 ENCOUNTER — Encounter (HOSPITAL_COMMUNITY): Payer: Self-pay | Admitting: Cardiothoracic Surgery

## 2018-05-04 LAB — BASIC METABOLIC PANEL
Anion gap: 7 (ref 5–15)
BUN: 10 mg/dL (ref 6–20)
CHLORIDE: 105 mmol/L (ref 101–111)
CO2: 27 mmol/L (ref 22–32)
CREATININE: 0.74 mg/dL (ref 0.44–1.00)
Calcium: 8.4 mg/dL — ABNORMAL LOW (ref 8.9–10.3)
GFR calc Af Amer: >60 mL/min (ref >60)
GLUCOSE: 138 mg/dL — AB (ref 65–99)
POTASSIUM: 3.9 mmol/L (ref 3.5–5.1)
SODIUM: 139 mmol/L (ref 135–145)

## 2018-05-04 LAB — CBC
HCT: 34.2 % — ABNORMAL LOW (ref 36.0–46.0)
Hemoglobin: 11.3 g/dL — ABNORMAL LOW (ref 12.0–15.0)
MCH: 31.1 pg (ref 26.0–34.0)
MCHC: 33 g/dL (ref 30.0–36.0)
MCV: 94.2 fL (ref 78.0–100.0)
PLATELETS: 181 10*3/uL (ref 150–400)
RBC: 3.63 MIL/uL — ABNORMAL LOW (ref 3.87–5.11)
RDW: 14.4 % (ref 11.5–15.5)
WBC: 13.4 10*3/uL — ABNORMAL HIGH (ref 4.0–10.5)

## 2018-05-04 LAB — GLUCOSE, CAPILLARY
Glucose-Capillary: 109 mg/dL — ABNORMAL HIGH (ref 65–99)
Glucose-Capillary: 123 mg/dL — ABNORMAL HIGH (ref 65–99)
Glucose-Capillary: 135 mg/dL — ABNORMAL HIGH (ref 65–99)
Glucose-Capillary: 138 mg/dL — ABNORMAL HIGH (ref 65–99)
Glucose-Capillary: 139 mg/dL — ABNORMAL HIGH (ref 65–99)
Glucose-Capillary: 149 mg/dL — ABNORMAL HIGH (ref 65–99)

## 2018-05-04 MED ORDER — LISINOPRIL 20 MG PO TABS
20.0000 mg | ORAL_TABLET | Freq: Every day | ORAL | Status: DC
  Administered 2018-05-04 – 2018-05-05 (×2): 20 mg via ORAL
  Filled 2018-05-04 (×2): qty 1

## 2018-05-04 MED ORDER — PROMETHAZINE HCL 25 MG/ML IJ SOLN: 6.2500 mg | Freq: Four times a day (QID) | INTRAMUSCULAR | Status: DC | PRN

## 2018-05-04 MED ORDER — ENOXAPARIN SODIUM 30 MG/0.3ML ~~LOC~~ SOLN
30.0000 mg | SUBCUTANEOUS | Status: DC
  Administered 2018-05-04 – 2018-05-07 (×4): 30 mg via SUBCUTANEOUS
  Filled 2018-05-04 (×4): qty 0.3

## 2018-05-04 MED ORDER — METOCLOPRAMIDE HCL 5 MG/ML IJ SOLN
10.0000 mg | Freq: Four times a day (QID) | INTRAMUSCULAR | Status: AC
  Administered 2018-05-04 – 2018-05-05 (×3): 10 mg via INTRAVENOUS
  Filled 2018-05-04 (×3): qty 2

## 2018-05-04 NOTE — Plan of Care (Signed)
Pt. Refusing to walk. States she does not feel well and her stomach is hurting. Attempted to get take to void and was weak getting to bedside commode. Will continue to try to encourage pt. To walk again today

## 2018-05-04 NOTE — Progress Notes (Addendum)
Nurse notified me patient has thrown up twice, despite being given 2 doses of Zofran. She denies abdominal pain. Also, nurse informed me patient has not urinated since the foley was removed earlier this am. Nurse bladder scanned patient and she has around 180 cc. I have made the patient NPO, stopped Zofran and ordered low dose Phenergan PRN, increased IVF. BMET already ordered for am. She is not using PCA much, but will not discontinue at this time. As later discussed with Dr. Servando Snare, will give 3 doses of Reglan. Etiology may be PCA. Patient instructed to limit its use. May have to discontinue PCA in am.

## 2018-05-04 NOTE — Progress Notes (Signed)
TCTS DAILY ICU PROGRESS NOTE                   [address]            [place] [address]          [phone]   1 Day Post-Op Procedure(s) (LRB): VIDEO BRONCHOSCOPY (N/A) VIDEO ASSISTED THORACOSCOPY (VATS)/LUNG RESECTION with insertion of ON-Q pump (Right) NODE DISSECTION (Right) RIGHT MIDDLE LOBECTOMY (Right)  Total Length of Stay:  LOS: 1 day   Subjective: Awake and alert, has not been out of bed yet  Objective: Vital signs in last 24 hours: Temp:  [97.4 F (36.3 C)-98.8 F (37.1 C)] 98.6 F (37 C) (05/07 0729) Pulse Rate:  [52-118] 64 (05/07 0729) Cardiac Rhythm: Normal sinus rhythm (05/07 0326) Resp:  [8-23] 18 (05/07 0729) BP: (116-186)/(62-96) 164/75 (05/07 0729) SpO2:  [96 %-100 %] 100 % (05/07 0729) Arterial Line BP: (153-187)/(60-109) 171/67 (05/07 0300) Weight:  [140 lb 14 oz (63.9 kg)] 140 lb 14 oz (63.9 kg) (05/06 1810)  Filed Weights   05/03/18 1810  Weight: 140 lb 14 oz (63.9 kg)    Weight change:    Hemodynamic parameters for last 24 hours:    Intake/Output from previous day: 05/06 0701 - 05/07 0700 In: 1900 [I.V.:1600] Out: 2545 [Urine:2055; Blood:150; Chest Tube:340]  Intake/Output this shift: Total I/O In: -  Out: 175 [Urine:175]  Current Meds: Scheduled Meds: . acetaminophen  1,000 mg Oral Q6H   Or  . acetaminophen (TYLENOL) oral liquid 160 mg/5 mL  1,000 mg Oral Q6H  . atorvastatin  40 mg Oral Daily  . bisacodyl  10 mg Oral Daily  . estrogens (conjugated)  0.3 mg Oral Q1400  . fentaNYL   Intravenous Q4H  . insulin aspart  0-24 Units Subcutaneous Q4H  . levothyroxine  75 mcg Oral QAC breakfast  . montelukast  10 mg Oral Daily  . senna-docusate  1 tablet Oral QHS   Continuous Infusions: . bupivacaine ON-Q pain pump    .  ceFAZolin (ANCEF) IV Stopped (05/04/18 0223)  . dextrose 5 % and 0.45% NaCl 100 mL/hr at 05/03/18 2228  . potassium chloride     PRN Meds:.diphenhydrAMINE **OR** diphenhydrAMINE, fentaNYL  (SUBLIMAZE) injection, fluticasone, meclizine, naloxone **AND** sodium chloride flush, ondansetron (ZOFRAN) IV, oxyCODONE, potassium chloride, traMADol  General appearance: alert, cooperative and no distress Neurologic: intact Heart: regular rate and rhythm, S1, S2 normal, no murmur, click, rub or gallop Lungs: rhonchi bilaterally Abdomen: soft, non-tender; bowel sounds normal; no masses,  no organomegaly Extremities: extremities normal, atraumatic, no cyanosis or edema and Homans sign is negative, no sign of DVT Wound: no air leak thas am  Lab Results: CBC:No results for input(s): WBC, HGB, HCT, PLT in the last 72 hours. BMET: No results for input(s): NA, K, CL, CO2, GLUCOSE, BUN, CREATININE, CALCIUM in the last 72 hours.  CMET: Lab Results  Component Value Date   WBC 5.2 04/29/2018   HGB 13.5 04/29/2018   HCT 39.9 04/29/2018   PLT 198 04/29/2018   GLUCOSE 90 04/29/2018   CHOL 177 02/13/2015   TRIG 113.0 02/13/2015   HDL 73.70 02/13/2015   LDLDIRECT 123.2 12/04/2008   LDLCALC 81 02/13/2015   ALT 14 04/29/2018   AST 24 04/29/2018   NA 142 04/29/2018   K 4.4 04/29/2018   CL 110 04/29/2018   CREATININE 0.79 04/29/2018   BUN 14 04/29/2018   CO2 24 04/29/2018   TSH 1.73 02/13/2015  INR 0.96 04/29/2018   Labs not drawn this am yet    PT/INR: No results for input(s): LABPROT, INR in the last 72 hours. Radiology: Dg Chest Port 1 View  Result Date: 05/03/2018 CLINICAL DATA:  Post VATS and lung resection, chest tube, hypertension EXAM: PORTABLE CHEST 1 VIEW COMPARISON:  Portable exam 1213 hours compared to 05/03/2018 at 0522 hours FINDINGS: RIGHT jugular central venous catheter with tip projecting over SVC near cavoatrial junction. Pair of RIGHT thoracostomy tubes newly identified. Upper normal size of cardiac silhouette. Mediastinal contours and pulmonary vascularity normal. Postsurgical changes in mid to lower RIGHT lung. Subsegmental atelectasis mid LEFT lung. No acute  infiltrate, pleural effusion, or pneumothorax. IMPRESSION: Postsurgical changes of the RIGHT chest as above. Electronically Signed   By: Lavonia Dana M.D.   On: 05/03/2018 12:39     Assessment/Plan: S/P Procedure(s) (LRB): VIDEO BRONCHOSCOPY (N/A) VIDEO ASSISTED THORACOSCOPY (VATS)/LUNG RESECTION with insertion of ON-Q pump (Right) NODE DISSECTION (Right) RIGHT MIDDLE LOBECTOMY (Right) Mobilize Diuresis See progression orders D/c foley and aline Ct to water seal     [name] 05/04/2018 7:45 AM

## 2018-05-05 ENCOUNTER — Inpatient Hospital Stay (HOSPITAL_COMMUNITY): Payer: Medicare Other

## 2018-05-05 LAB — GLUCOSE, CAPILLARY
Glucose-Capillary: 101 mg/dL — ABNORMAL HIGH (ref 65–99)
Glucose-Capillary: 109 mg/dL — ABNORMAL HIGH (ref 65–99)
Glucose-Capillary: 110 mg/dL — ABNORMAL HIGH (ref 65–99)
Glucose-Capillary: 115 mg/dL — ABNORMAL HIGH (ref 65–99)
Glucose-Capillary: 129 mg/dL — ABNORMAL HIGH (ref 65–99)
Glucose-Capillary: 96 mg/dL (ref 65–99)

## 2018-05-05 LAB — COMPREHENSIVE METABOLIC PANEL
ALK PHOS: 48 U/L (ref 38–126)
ALT: 16 U/L (ref 14–54)
ANION GAP: 7 (ref 5–15)
AST: 34 U/L (ref 15–41)
Albumin: 3.3 g/dL — ABNORMAL LOW (ref 3.5–5.0)
BUN: 8 mg/dL (ref 6–20)
CALCIUM: 9 mg/dL (ref 8.9–10.3)
CHLORIDE: 106 mmol/L (ref 101–111)
CO2: 28 mmol/L (ref 22–32)
Creatinine, Ser: 0.7 mg/dL (ref 0.44–1.00)
GFR calc Af Amer: >60 mL/min (ref >60)
GFR calc non Af Amer: >60 mL/min (ref >60)
GLUCOSE: 121 mg/dL — AB (ref 65–99)
POTASSIUM: 3.6 mmol/L (ref 3.5–5.1)
Sodium: 141 mmol/L (ref 135–145)
Total Bilirubin: 0.5 mg/dL (ref 0.3–1.2)
Total Protein: 6.3 g/dL — ABNORMAL LOW (ref 6.5–8.1)

## 2018-05-05 LAB — CBC
HEMATOCRIT: 36.4 % (ref 36.0–46.0)
HEMOGLOBIN: 11.9 g/dL — AB (ref 12.0–15.0)
MCH: 30.8 pg (ref 26.0–34.0)
MCHC: 32.7 g/dL (ref 30.0–36.0)
MCV: 94.3 fL (ref 78.0–100.0)
Platelets: 196 10*3/uL (ref 150–400)
RBC: 3.86 MIL/uL — AB (ref 3.87–5.11)
RDW: 14.6 % (ref 11.5–15.5)
WBC: 14.6 10*3/uL — ABNORMAL HIGH (ref 4.0–10.5)

## 2018-05-05 MED ORDER — LACTULOSE 10 GM/15ML PO SOLN
20.0000 g | Freq: Once | ORAL | Status: DC
  Filled 2018-05-05: qty 30

## 2018-05-05 NOTE — Op Note (Signed)
NAME: [name], [name] MEDICAL RECORD PZ:[id number] ACCOUNT [id number] DATE OF BIRTH:[birth year] FACILITY: [facility] LOCATION: [place], [place]  OPERATIVE REPORT  DATE OF PROCEDURE:  05/03/2018  PREOPERATIVE DIAGNOSIS:  Right middle lobe adenocarcinoma of the lung.  POSTOPERATIVE DIAGNOSIS:  Right middle lobe adenocarcinoma of the lung.  SURGICAL PROCEDURE:  Bronchoscopy, right video-assisted thoracoscopy, right middle lobectomy, lymph node dissection, and placement of On-Q.  SURGEON:  Lanelle Bal, MD  FIRST ASSISTANT:  Shaaron Adler, PA  BRIEF HISTORY:  The patient is an 84 year old female who is very active, has not been a smoker for many years, who presented to the pulmonary service in 07/2017 with abnormal ground-glass opacity in the right middle lobe.  This was followed and was  slowly enlarging. On followup scan, a needle biopsy was performed that confirmed adenocarcinoma.  She was referred for consideration of surgical resection versus stereotactic radiotherapy.  PET scan and CT scan suggested clinical stage I disease.   Risks and options of surgery were discussed with the patient.  With her active functional status, surgery was recommended.  She agreed and signed informed consent.  DESCRIPTION OF PROCEDURE:  The patient underwent general endotracheal anesthesia with a double-lumen endotracheal tube, a 35.  Appropriate timeout was performed, and then we performed bronchoscopy through the endotracheal tube, confirming its location  and examined to the subsegmental level without obvious endobronchial lesions or bronchial abnormalities or anomalies.  The scope was then removed.  The patient was turned in lateral decubitus position, right side, and preoperatively had been marked.  The  right chest was prepped with Betadine and draped in a sterile manner.  A second timeout was performed.  We then proceeded with a small incision port site approximately 5th intercostal  space.  Through this, a 30-degree scope was placed and gave  reasonable visualization of the chest.  Two additional port sites were placed slightly lower, and the initial incision was enlarged slightly and became our working incision.  Through this incision and through the 2 additional ports, we then proceeded  with developing fissures and freeing the middle lobe.  Pulmonary vein branches from the middle lobe were identified and stapled with a vascular stapler.  We then identified the vascular supply to the middle lobe in a similar fashion.  This was encircled  and stapled with a vascular stapler.  The major fissure was then completed up to the bronchus to the middle lobe with a gold-load stapler.  A green-load stapler was then used to divide the bronchus.  Initially, the bronchus was clamped and tested with  good inflation of the lower and upper lobe.  The bronchus was divided.  The minor fissure was then completed with a gold-load stapler.  The specimen was put in a specimen bag and brought out through the incision.  Lymph node dissection was then carried  out, and each node samples were placed in separate cups and labeled ____ nodal position.  The specimen was submitted to pathology, confirming the malignancy was in the specimen with clear margins.  Bronchial margins were clear.  An On-Q device was  tunneled subpleurally. The small incision was   was closed.  The 2 port sites were used as chest tube sites.  A 28-Blake drain and a 28-standard chest tube were placed through the port sites and secured in place.  The lung inflated well without air leak.   Dry dressings were applied.  Sponge and needle count was reported as correct at the completion of the  procedure.  The patient was then awakened and extubated in the operating room and transferred to the recovery room for further postoperative care.   Estimated blood loss 100 to 150 mL.  She did not require any blood transfusion.  LN/NUANCE  D:05/05/2018  T:05/05/2018 JOB:[id number]

## 2018-05-05 NOTE — Progress Notes (Addendum)
[address]       [place] [address]             [phone]      2 Days Post-Op Procedure(s) (LRB): VIDEO BRONCHOSCOPY (N/A) VIDEO ASSISTED THORACOSCOPY (VATS)/LUNG RESECTION with insertion of ON-Q pump (Right) NODE DISSECTION (Right) RIGHT MIDDLE LOBECTOMY (Right) Subjective: Nausea a little better but not really hungry Not using PCA  Objective: Vital signs in last 24 hours: Temp:  [97.8 F (36.6 C)-98.9 F (37.2 C)] 98.9 F (37.2 C) (05/08 0700) Pulse Rate:  [54-83] 70 (05/08 0700) Cardiac Rhythm: Normal sinus rhythm (05/08 0424) Resp:  [10-20] 19 (05/08 0700) BP: (146-194)/(69-101) 194/89 (05/08 0700) SpO2:  [95 %-100 %] 97 % (05/08 0700) Arterial Line BP: (178-229)/(74-114) 178/74 (05/07 0900)  Hemodynamic parameters for last 24 hours:    Intake/Output from previous day: 05/07 0701 - 05/08 0700 In: 2155.8 [I.V.:2155.8] Out: 2393 [Urine:1975; Chest Tube:418] Intake/Output this shift: No intake/output data recorded.  General appearance: alert, cooperative and no distress Heart: regular rate and rhythm Lungs: clear to auscultation bilaterally Abdomen: benign Extremities: PAS in place Wound: incis healing well  Lab Results: Recent Labs    05/04/18 0755 05/05/18 0258  WBC 13.4* 14.6*  HGB 11.3* 11.9*  HCT 34.2* 36.4  PLT 181 196   BMET:  Recent Labs    05/04/18 0755 05/05/18 0258  NA 139 141  K 3.9 3.6  CL 105 106  CO2 27 28  GLUCOSE 138* 121*  BUN 10 8  CREATININE 0.74 0.70  CALCIUM 8.4* 9.0    PT/INR: No results for input(s): LABPROT, INR in the last 72 hours. ABG    Component Value Date/Time   PHART 7.494 (H) 04/29/2018 1323   HCO3 24.8 04/29/2018 1323   O2SAT 96.8 04/29/2018 1323   CBG (last 3)  Recent Labs    05/04/18 2039 05/05/18 0022 05/05/18 0435  GLUCAP 123* 115* 110*    Meds Scheduled Meds: . acetaminophen  1,000 mg Oral Q6H   Or  . acetaminophen (TYLENOL) oral liquid 160 mg/5 mL  1,000 mg  Oral Q6H  . atorvastatin  40 mg Oral Daily  . bisacodyl  10 mg Oral Daily  . enoxaparin (LOVENOX) injection  30 mg Subcutaneous Q24H  . fentaNYL   Intravenous Q4H  . insulin aspart  0-24 Units Subcutaneous Q4H  . levothyroxine  75 mcg Oral QAC breakfast  . lisinopril  20 mg Oral Daily  . montelukast  10 mg Oral Daily  . senna-docusate  1 tablet Oral QHS   Continuous Infusions: . bupivacaine ON-Q pain pump    . dextrose 5 % and 0.45% NaCl 100 mL/hr at 05/04/18 2252  . potassium chloride     PRN Meds:.diphenhydrAMINE **OR** diphenhydrAMINE, fentaNYL (SUBLIMAZE) injection, fluticasone, meclizine, naloxone **AND** sodium chloride flush, oxyCODONE, potassium chloride, promethazine, traMADol  Xrays Dg Chest Port 1 View  Result Date: 05/04/2018 CLINICAL DATA:  Evaluate chest tube, history of lung nodule, and subsequent right middle lobectomy EXAM: PORTABLE CHEST 1 VIEW COMPARISON:  Portable chest x-ray of 05/03/2018 and CT chest of 03/04/2017 FINDINGS: Right chest tubes remain and no pneumothorax is seen. Linear atelectasis is noted in the right mid lung with probable effusion on the right and volume loss at the right lung base. The left lung is clear. Right IJ central venous line tip overlies the lower SVC. Cardiomegaly is stable. There are degenerative changes noted in the right shoulder. IMPRESSION: 1. Right chest tubes  remain.  No pneumothorax is seen. 2. Some increase in the atelectasis in the right mid lung. 3. Probable right pleural effusion.  Stable cardiomegaly. Electronically Signed   By: Ivar Drape M.D.   On: 05/04/2018 10:20   Dg Chest Port 1 View  Result Date: 05/03/2018 CLINICAL DATA:  Post VATS and lung resection, chest tube, hypertension EXAM: PORTABLE CHEST 1 VIEW COMPARISON:  Portable exam 1213 hours compared to 05/03/2018 at 0522 hours FINDINGS: RIGHT jugular central venous catheter with tip projecting over SVC near cavoatrial junction. Pair of RIGHT thoracostomy tubes newly  identified. Upper normal size of cardiac silhouette. Mediastinal contours and pulmonary vascularity normal. Postsurgical changes in mid to lower RIGHT lung. Subsegmental atelectasis mid LEFT lung. No acute infiltrate, pleural effusion, or pneumothorax. IMPRESSION: Postsurgical changes of the RIGHT chest as above. Electronically Signed   By: Lavonia Dana M.D.   On: 05/03/2018 12:39    Assessment/Plan: S/P Procedure(s) (LRB): VIDEO BRONCHOSCOPY (N/A) VIDEO ASSISTED THORACOSCOPY (VATS)/LUNG RESECTION with insertion of ON-Q pump (Right) NODE DISSECTION (Right) RIGHT MIDDLE LOBECTOMY (Right)   1 overall doing well 2 will d/c PCA with min pain and nausea 3 clear liquids, advance as able 4 no air leak on H20 seal, almost 500 cc drainage yesterday, d/c anterior tube, keep posterior 5 excellent UOP- will decrease IVF rate 6 hypertensive- give lisinopril early, might need additional agent 7 push pulm toilet for atx 8 advance activity as able    LOS: 2 days    John Giovanni 05/05/2018 Nausea better this am, more alert, one chest tube out, no air leak I have seen and examined Particia Lather and agree with the above assessment  and plan.  Grace Isaac MD Beeper [phone] Office [phone] 05/05/2018 8:22 AM

## 2018-05-05 NOTE — Progress Notes (Signed)
Called via nursing in regards to patient having an episode of unresponsiveness while on the bed side commode.  They state she was ambulating around the hall.  The patient then had to go to the bathroom.  While on the bedside commode the patient was straining very hard to have a BM and loss consciousness.  They placed patient back in bed, placed in trendelenberg and patient recovered.   Patient is alert on exam.  She is mostly complaining of abdominal pain and states she really needs to move her bowels.  She states she was straining a lot while trying to pass stool on the commode.  HR 70s NSR, Bp 132/74  Gen: no apparent distress Heart: RRR Lungs: CTA bilaterally Abd: non-distended, normal BS Ext: no edema  A/p;  1. Likely vagal episode while trying to have BM.Marland Kitchen Will order lactulose to try and help facilitate BM 2. Hypertensive- SBP in the 190s this morning, treated with Lisinopril--- could have some component of orthostasis to epsidode with drastic drop in BP.. Will place parameters for administration of BP medication 3. Continue IV fluids for now 4. Dispo- patient stable, add lactulose, parameters for BP medications   Ellwood Handler, PA-C

## 2018-05-05 NOTE — Care Management Note (Signed)
Case Management Note  Patient Details  Name: KHAYLA KOPPENHAVER MRN: [id number] Date of Birth: 30-Jun-1933  Subjective/Objective:     Pt is s/p VATS               Action/Plan:  PTA independent from home.  Per bedside nurse pt ambulating around the hall on the unit.  Pt has PCP and denied barriers to obtaining/paying for medications.  CM will continue to follow    Expected Discharge Date:                  Expected Discharge Plan:  [place]  In-House Referral:     Discharge planning Services  CM Consult  Post Acute Care Choice:    Choice offered to:     DME Arranged:    DME Agency:     HH Arranged:    HH Agency:     Status of Service:     If discussed at [employer], dates discussed:    Additional Comments:  Maryclare Labrador, RN 05/05/2018, 3:50 PM

## 2018-05-05 NOTE — Consult Note (Signed)
            [facility] CM Primary Care Navigator  05/05/2018  [name] March 24, 1933 [id number]   Went to see patient at the bedsideto identify possible discharge needs. Patient reports that primary care provider sent her to pulmonology in 8/18 to evaluate what was thought to be inflammatory process, follow-up CT scan showed persistence of mass in the right middle lobe. Biopsy was performed confirming lung cancer which resulted to this admission.(underwent Video Bronchoscopy and Video Assisted Thoracoscopy (VATS)/ Lung Resection (Right)  Patientendorses Dr.James John with [facility] [place] as herprimary care provider.   [facility] [facility].   Patient reportsmanagingher ownmedications at home using "pill box" system filled every week.  Patient statesthatshe was driving prior to this admissionbut son [name]) and grandson [name]) will be able to providetransportation to herdoctors' appointments after discharge.  Patient verbalized that son and grandson lives with patient and will assist with her needs at home when available. She mentioned that she can depend on friend [name]) to help her, and her church friends as well.  Anticipated discharge plan is home with home health services.  Patientvoiced understanding to call primary care provider's officewhen shereturnshomefor a post discharge follow-up visit within1- 2 weeksor sooner if needs arise.Patient letter (with PCP's contact number) was provided asherreminder.  Discussed with patientregarding [facility] CM services available for health managementand resourcesat home butshe denies any current needs or issues at this point.  Patient voiced understanding of needto seekreferral from primary care provider to [facility] care management if deemednecessaryand appropriate for anyservices in thenear future.  [facility] care  management information was provided for future needs that she may have.  Patienthowever, opted and verbally agreed with EMMI calls to monitor recovery at home. Referral made for [facility] General calls after discharge.   For additional questions please contact:  Edwena Felty A. Shondell Poulson, BSN, RN-BC [facility] PRIMARY CARE Navigator Cell: [phone]

## 2018-05-05 NOTE — Progress Notes (Signed)
dc'ed anterior chest tube per md orders. Followed policies and procedures. Patient tolerated well. ctx1 to water seal with no air leak. Dressing is clean dry and intact. On q is still in place. Will continue to monitor.

## 2018-05-05 NOTE — Progress Notes (Addendum)
Patient ambulated 100 feet in the hallway with walker and RN x2. After ambulation patient wanted to sit on bedside commode to try to have a bowel movement. While patient was on the bedside commode her heart rate dropped into the 50's. Walked back into the room to find patient slumped on the commode very minimally responsive, diaphoretic. Rass -3. No response to sternal rub. RN x2 lifted patient into bed, placed in trendelenburg in the bed for 20 minutes. CBG 120s. Vital signs at 11:28: HR 52 BP 126/60 map 80, normal hypertensive at 190sys. NS at 87ml/hr. 11:42  HR 79 irregular with pac's bp 158/77 map 101  On reevaluation neuro intact.  Notified [place], [place]. PA on way to bedside to assess.   1147: PA at bedside.

## 2018-05-06 ENCOUNTER — Inpatient Hospital Stay (HOSPITAL_COMMUNITY): Payer: Medicare Other

## 2018-05-06 ENCOUNTER — Other Ambulatory Visit: Payer: Self-pay | Admitting: *Deleted

## 2018-05-06 LAB — GLUCOSE, CAPILLARY
Glucose-Capillary: 101 mg/dL — ABNORMAL HIGH (ref 65–99)
Glucose-Capillary: 104 mg/dL — ABNORMAL HIGH (ref 65–99)
Glucose-Capillary: 108 mg/dL — ABNORMAL HIGH (ref 65–99)
Glucose-Capillary: 108 mg/dL — ABNORMAL HIGH (ref 65–99)
Glucose-Capillary: 119 mg/dL — ABNORMAL HIGH (ref 65–99)
Glucose-Capillary: 123 mg/dL — ABNORMAL HIGH (ref 65–99)

## 2018-05-06 MED ORDER — LISINOPRIL 20 MG PO TABS
40.0000 mg | ORAL_TABLET | Freq: Every day | ORAL | Status: DC
  Administered 2018-05-06 – 2018-05-09 (×4): 40 mg via ORAL
  Filled 2018-05-06 (×5): qty 2

## 2018-05-06 MED ORDER — WHITE PETROLATUM EX OINT
TOPICAL_OINTMENT | CUTANEOUS | Status: AC
  Filled 2018-05-06: qty 28.35

## 2018-05-06 MED ORDER — AMLODIPINE BESYLATE 5 MG PO TABS
5.0000 mg | ORAL_TABLET | Freq: Every day | ORAL | Status: DC
  Administered 2018-05-06: 5 mg via ORAL
  Filled 2018-05-06: qty 1

## 2018-05-06 NOTE — Progress Notes (Signed)
[address]       ,[place] [address]             [phone]      3 Days Post-Op Procedure(s) (LRB): VIDEO BRONCHOSCOPY (N/A) VIDEO ASSISTED THORACOSCOPY (VATS)/LUNG RESECTION with insertion of ON-Q pump (Right) NODE DISSECTION (Right) RIGHT MIDDLE LOBECTOMY (Right) Subjective: Feels fair, did have a BM, some productive sputum. BP running high  Objective: Vital signs in last 24 hours: Temp:  [97.7 F (36.5 C)-98.4 F (36.9 C)] 98.4 F (36.9 C) (05/09 0408) Pulse Rate:  [61-80] 72 (05/09 0408) Cardiac Rhythm: Normal sinus rhythm (05/09 0400) Resp:  [13-20] 13 (05/09 0408) BP: (132-173)/(75-110) 160/75 (05/09 0408) SpO2:  [95 %-100 %] 95 % (05/09 0408)  Hemodynamic parameters for last 24 hours:    Intake/Output from previous day: 05/08 0701 - 05/09 0700 In: 1160 [I.V.:1160] Out: 1123 [Urine:1000; Stool:3; Chest Tube:120] Intake/Output this shift: No intake/output data recorded.  General appearance: alert, cooperative and no distress Heart: regular rate and rhythm Lungs: clear to auscultation bilaterally Abdomen: benign exam, + BS soft, nontender or distended Extremities: minor edema Wound: incis healing well  Lab Results: Recent Labs    05/04/18 0755 05/05/18 0258  WBC 13.4* 14.6*  HGB 11.3* 11.9*  HCT 34.2* 36.4  PLT 181 196   BMET:  Recent Labs    05/04/18 0755 05/05/18 0258  NA 139 141  K 3.9 3.6  CL 105 106  CO2 27 28  GLUCOSE 138* 121*  BUN 10 8  CREATININE 0.74 0.70  CALCIUM 8.4* 9.0    PT/INR: No results for input(s): LABPROT, INR in the last 72 hours. ABG    Component Value Date/Time   PHART 7.494 (H) 04/29/2018 1323   HCO3 24.8 04/29/2018 1323   O2SAT 96.8 04/29/2018 1323   CBG (last 3)  Recent Labs    05/05/18 2054 05/06/18 0005 05/06/18 0406  GLUCAP 101* 104* 108*    Meds Scheduled Meds: . acetaminophen  1,000 mg Oral Q6H   Or  . acetaminophen (TYLENOL) oral liquid 160 mg/5 mL  1,000 mg Oral  Q6H  . atorvastatin  40 mg Oral Daily  . bisacodyl  10 mg Oral Daily  . enoxaparin (LOVENOX) injection  30 mg Subcutaneous Q24H  . insulin aspart  0-24 Units Subcutaneous Q4H  . lactulose  20 g Oral Once  . levothyroxine  75 mcg Oral QAC breakfast  . lisinopril  20 mg Oral Daily  . montelukast  10 mg Oral Daily  . senna-docusate  1 tablet Oral QHS   Continuous Infusions: . bupivacaine ON-Q pain pump    . dextrose 5 % and 0.45% NaCl 50 mL/hr at 05/05/18 1500  . potassium chloride     PRN Meds:.fentaNYL (SUBLIMAZE) injection, fluticasone, meclizine, oxyCODONE, potassium chloride, promethazine, traMADol  Xrays Dg Chest Port 1 View  Result Date: 05/05/2018 CLINICAL DATA:  Chest tube in place. EXAM: PORTABLE CHEST 1 VIEW COMPARISON:  Radiograph of May 04, 2018. FINDINGS: Stable cardiomediastinal silhouette. Atherosclerosis of thoracic aorta is noted. Left lung is clear. Two right-sided chest tubes are again noted which are unchanged in position. No pneumothorax is noted stable right lateral pleural thickening or loculated effusion is noted. Stable right midlung atelectasis or scarring is noted. Postsurgical changes are noted in the medial portion of the right lung. Bony thorax is unremarkable. IMPRESSION: Stable position of 2 right-sided chest tubes without pneumothorax. Stable chronic findings as described above. Electronically Signed  By: Marijo Conception, M.D.   On: 05/05/2018 10:07   Dg Chest Port 1 View  Result Date: 05/04/2018 CLINICAL DATA:  Evaluate chest tube, history of lung nodule, and subsequent right middle lobectomy EXAM: PORTABLE CHEST 1 VIEW COMPARISON:  Portable chest x-ray of 05/03/2018 and CT chest of 03/04/2017 FINDINGS: Right chest tubes remain and no pneumothorax is seen. Linear atelectasis is noted in the right mid lung with probable effusion on the right and volume loss at the right lung base. The left lung is clear. Right IJ central venous line tip overlies the lower SVC.  Cardiomegaly is stable. There are degenerative changes noted in the right shoulder. IMPRESSION: 1. Right chest tubes remain.  No pneumothorax is seen. 2. Some increase in the atelectasis in the right mid lung. 3. Probable right pleural effusion.  Stable cardiomegaly. Electronically Signed   By: Ivar Drape M.D.   On: 05/04/2018 10:20    Assessment/Plan: S/P Procedure(s) (LRB): VIDEO BRONCHOSCOPY (N/A) VIDEO ASSISTED THORACOSCOPY (VATS)/LUNG RESECTION with insertion of ON-Q pump (Right) NODE DISSECTION (Right) RIGHT MIDDLE LOBECTOMY (Right)  1 improving slowly 2 CXR appears stable, but + air leak, - keep chest tube in place 3 increase lisinopril to home dose(40mg ), may need additional agent if not better controlled 4 BS well controlled 5 push rehab and pulm toilet   LOS: 3 days    [name] 05/06/2018

## 2018-05-06 NOTE — Progress Notes (Signed)
Pt refuses to ambulate now but says she will after lunch.   [place]  Beau Rams[place], RN

## 2018-05-06 NOTE — Progress Notes (Signed)
Pt states "I think I'm having hallucinations" and "everything is upside down..I'm falling into the floor, the tv is falling to the floor, the flowers are falling to the floor." Asked pt if it is because she feels dizzy but pt insists this is different than her typical vertigo. BP increased. PA notified. Will continue to monitor.   [place]  Archimedes Harold, RN

## 2018-05-06 NOTE — Progress Notes (Signed)
Having issues with pyxis not allowing to pull Lisinopril (will give early bc inc BP). Called pharm and they will send a dose up.   [place]  Thinh Cuccaro, RN

## 2018-05-07 ENCOUNTER — Inpatient Hospital Stay (HOSPITAL_COMMUNITY): Payer: Medicare Other

## 2018-05-07 LAB — GLUCOSE, CAPILLARY
Glucose-Capillary: 100 mg/dL — ABNORMAL HIGH (ref 65–99)
Glucose-Capillary: 101 mg/dL — ABNORMAL HIGH (ref 65–99)
Glucose-Capillary: 103 mg/dL — ABNORMAL HIGH (ref 65–99)
Glucose-Capillary: 107 mg/dL — ABNORMAL HIGH (ref 65–99)
Glucose-Capillary: 118 mg/dL — ABNORMAL HIGH (ref 65–99)
Glucose-Capillary: 130 mg/dL — ABNORMAL HIGH (ref 65–99)
Glucose-Capillary: 89 mg/dL (ref 65–99)

## 2018-05-07 LAB — BASIC METABOLIC PANEL
ANION GAP: 6 (ref 5–15)
BUN: 8 mg/dL (ref 6–20)
CHLORIDE: 105 mmol/L (ref 101–111)
CO2: 32 mmol/L (ref 22–32)
Calcium: 8.8 mg/dL — ABNORMAL LOW (ref 8.9–10.3)
Creatinine, Ser: 0.78 mg/dL (ref 0.44–1.00)
Glucose, Bld: 100 mg/dL — ABNORMAL HIGH (ref 65–99)
POTASSIUM: 3.2 mmol/L — AB (ref 3.5–5.1)
SODIUM: 143 mmol/L (ref 135–145)

## 2018-05-07 MED ORDER — AMLODIPINE BESYLATE 10 MG PO TABS
10.0000 mg | ORAL_TABLET | Freq: Every day | ORAL | Status: DC
  Administered 2018-05-07 – 2018-05-09 (×3): 10 mg via ORAL
  Filled 2018-05-07 (×3): qty 1

## 2018-05-07 MED ORDER — OXYCODONE HCL 5 MG PO TABS: 5.0000 mg | ORAL_TABLET | ORAL | Status: DC | PRN

## 2018-05-07 MED ORDER — POTASSIUM CHLORIDE CRYS ER 20 MEQ PO TBCR
40.0000 meq | EXTENDED_RELEASE_TABLET | Freq: Once | ORAL | Status: AC
  Administered 2018-05-07: 40 meq via ORAL
  Filled 2018-05-07: qty 2

## 2018-05-07 MED ORDER — TRAMADOL HCL 50 MG PO TABS: 50.0000 mg | ORAL_TABLET | Freq: Four times a day (QID) | ORAL | Status: DC | PRN

## 2018-05-07 NOTE — Discharge Summary (Signed)
Physician Discharge Summary  Patient ID: [name] MRN: [id number] DOB/AGE: September 12, 1933 84 y.o.  Admit date: 05/03/2018 Discharge date: 05/09/2018  Admission Diagnoses: Right middle lobe lung nodule  Discharge Diagnoses:  Active Problems:   Lung nodule  Patient Active Problem List   Diagnosis Date Noted  . Lung nodule 05/03/2018  . Adenocarcinoma, lung, right ([place]) 04/01/2018  . Abnormal CT of the chest 09/05/2017  . Pleurisy   . Atypical chest pain 08/20/2017  . Arthritis of right ankle 04/14/2017  . Right ankle pain 03/19/2017  . Varicose veins of bilateral lower extremities with other complications 27/51/7001  . Injury of face 12/15/2016  . Vertigo 07/15/2016  . Skin lesion on examination 07/15/2016  . Right cervical radiculopathy 05/01/2016  . Memory dysfunction 09/16/2015  . Palpitations 06/26/2015  . Pain in the chest 06/26/2015  . Left shoulder pain 02/13/2015  . Osteopenia 06/09/2012  . Vitamin D deficiency 06/09/2012  . Preventative health care 03/07/2012  . Right shoulder pain 03/03/2012  . Acute cystitis without hematuria 03/24/2011  . Unspecified vitamin D deficiency 12/26/2009  . OSTEOARTHROS UNSPEC GEN/LOC PELV REGION&THIGH 06/29/2009  . FRACTURE, PELVIS, LEFT 06/29/2009  . PELVIC  PAIN 06/26/2009  . MENOPAUSAL DISORDER 12/04/2008  . FATIGUE 12/04/2008  . HOT FLASHES 08/17/2008  . Hyperlipidemia 01/30/2008  . Anxiety state 01/30/2008  . Allergic rhinitis 01/30/2008  . GERD 01/30/2008  . OSA (obstructive sleep apnea) 01/27/2008  . Hypothyroidism 10/12/2007  . Essential hypertension 10/12/2007    History of present illness:  Patient is a 84 year old female seen by Dr. Servando Snare and thoracic surgical consultation after having a needle biopsy of a lesion in the right middle lobe showing adenocarcinoma.  She recently presented in August 2018 with what was thought to be an inflammatory process.  She continued to obtain intermittent scans and there is a  persistent finding of a mass in the right middle lobe.  The patient is a former smoker but has not smoked for more than 50 years.  Due to these findings she was felt to be a candidate for surgical resection and was admitted to the hospital for the procedure.  Discharged Condition: fair  Hospital Course: The patient was admitted electively and on 05/03/2018 taken to the operating room where she underwent the below described procedure.  She tolerated it well and was taken to the surgical intensive care unit in stable condition.  Postoperative hospital course:  The patient has made slow and steady progress..  She has had some difficulties with postoperative nausea but this is showing gradual resolution with usual means.  She has had some dizziness but suffers from chronic vertigo.  She does have a mild expected acute blood loss anemia and hemoglobin and hematocrit are stable at 11.9/36.4.  Renal function is within normal limits.  The chest tube was monitored in the usual fashion and both tubes have been discontinued over time following usual protocols.  She does have some postoperative atelectasis but continues with aggressive pulmonary toilet and physical rehabilitation modalities.  Incisions are noted to be healing well without evidence of infection.  Her appetite is gradually improving and she is tolerating diet.  She has had some difficulty with hypertension and has been placed back on her preoperative lisinopril dose.  Additionally she has been started on Norvasc 10 mg p.o. daily and is showing improvement in her blood pressure trends. He does have a mild leukocytosis and is being followed clinically.  Her wounds are clean, dry, and continuing to  heal. She is ambulating on room air. She has been tolerating a diet and has had a bowel movement. She is not having any fevers. She tripped, fell, and hit her head the evening of 05/10. CT of the head did not show any acute abnormality. She was kept for observation  on 05/11. She had no complaints and her neuro exam showed no focal deficits the morning of 05/12.She is felt surgically stable for discharge today.  Consults: None  Significant Diagnostic Studies: Routine postoperative labs and serial chest x-rays  Treatments:   OPERATIVE REPORT  DATE OF PROCEDURE:  05/03/2018  PREOPERATIVE DIAGNOSIS:  Right middle lobe adenocarcinoma of the lung.  POSTOPERATIVE DIAGNOSIS:  Right middle lobe adenocarcinoma of the lung.  SURGICAL PROCEDURE:  Bronchoscopy, right video-assisted thoracoscopy, right middle lobectomy, lymph node dissection, and placement of On-Q.  SURGEON:  Lanelle Bal, MD  FIRST ASSISTANT:  Shaaron Adler, PA   Discharge Exam: Blood pressure 113/89, pulse 76, temperature 98.9 F (37.2 C), temperature source Oral, resp. rate 15, height 5' (1.524 m), weight 125 lb 9.6 oz (57 kg), SpO2 99 %.  Cardiovascular: RRR Pulmonary: Clear to auscultation bilaterally Abdomen: Soft, non tender, bowel sounds present. Extremities:No  lower extremity edema. Wounds: Clean and dry.  No erythema or signs of infection. Neuro: Intact without focal deficits    Disposition: Discharge disposition: 01-Home or Self Care     Discharged home FINAL DIAGNOSIS Diagnosis 1. Lung, resection (segmental or lobe) - ADENOCARCINOMA, WELL-DIFFERENTIATED, (PAPILLARY 90%, LEPIDIC 10%) - MARGINS UNINVOLVED BY CARCINOMA - SEE ONCOLOGY TABLE AND COMMENT BELOW 2. Lymph node, biopsy, 11 R - NO CARCINOMA IDENTIFIED IN ONE LYMPH NODE (0/1) 3. Lymph node, biopsy, 10 R - NO CARCINOMA IDENTIFIED IN ONE LYMPH NODE (0/1) 4. Lymph node, biopsy, 12 R - BENIGN LUNG TISSUE WITH CHRONIC INFLAMMATION - NO LYMPHOID TISSUE IDENTIFIED 5. Lymph node, biopsy, 12 R #2 - TISSUE WITH THERMAL ARTIFACT AND FIBRIN - NO LYMPHOID TISSUE IDENTIFIED - SEE COMMENT 6. Lymph node, biopsy, 12 R #3 - TISSUE WITH THERMAL ARTIFACT AND FIBRIN - NO LYMPHOID TISSUE IDENTIFIED - SEE  COMMENT Microscopic Comment 1. LUNG: Procedure: Lobectomy Specimen Laterality: Right Tumor Site: Middle lobe of lung Tumor Size: 1.1 cm Total Tumor Size Inclusive of Invasive and Lepidic Components:1.1 Invasive Tumor Size: 0.9 cm (see comment) Tumor Focality: Single tumor Histologic Type: Invasive adenocarcinoma, papillary predominant 1 of 3 FINAL for Schoenfelder, Yolandra H [phone]) Microscopic Comment(continued) Visceral Pleura Invasion: Not identified Lymphovascular Invasion: Not identified Direct Invasion of Adjacent Structures: No adjacent structures present Margins: All margins are uninvolved by tumor Treatment Effect: No known presurgical therapy Regional Lymph Nodes: Number of Lymph Nodes Involved: 0 Number of Lymph Nodes Examined: 2 Pathologic Stage Classification (pTNM, AJCC 8th Edition): pT1a, pN0 Ancillary Studies: Available upon request Representative Tumor Block: 1D Comment(s): The tumor has a papillary architecture with a periphery of lepidic growth (~10% of tumor). (v4.0.0.3) 5. and 6. Deeper levels were examined. Thressa Sheller MD Pathologist, Electronic Signature (Case signed 05/07/2018) Intraoperative Diagnosis 1. RAPID INTRAOPERATIVE CONSULTATION: RIGHT MIDDLE LOBE LUNG, FROZEN SECTION DIAGNOSIS: FROZEN SECTION A: BRONCHIAL MARGIN - NO CARCINOMA IDENTIFIED. FROZEN SECTION B: LUNG MASS - ADENOCARCINOMA, WELL DIFFERENTIATED. (DB) Specimen Gross and Clinical Information Specimen(s) Obtained: 1. Lung, resection (segmental or lobe) 2. Lymph node, biopsy, 11 R 3. Lymph node, biopsy, 10 R 4. Lymph node, biopsy, 12 R 5. Lymph node, biopsy, 12 R #2 6. Lymph node, biopsy, 12 R #3 Gross 1. Specimen: Right middle lobe, received  fresh for rapid intraoperative consult. Specimen integrity (intact/incised/disrupted): Intact. Size, weight: 9.7 x 7.7 x 3.2 cm, 69 grams. Pleura: The pleura is smooth, tan-red to anthracotic. There is a suture attached with a palpable nodule  in the adjacent lung. Lesion: At the attached suture, there is a 1.1 x 1.0 x 0.9 cm firm tan-pink nodule with ill-defined borders. Margin(s): The nodule is located 2.5 cm from the hilum. The nodule abuts the pleura which is grossly intact. Hilar vessels: Grossly free of tumor. Nonneoplastic parenchyma: Spongy tan-red. Lymph nodes, level 12: There are no lymph nodes identified. Block Summary: A section of the bronchial margin is submitted for frozen section and a section of the nodule is submitted for frozen section. Sections are submitted in six cassettes. A = frozen section of bronchial margin B = frozen section of nodule 2 of 3 FINAL for Halley, Hollye H ([id number]) Gross(continued) C = vascular margins D-E = remainder of nodule F = uninvolved parenchyma. 2. Received fresh is a 1.5 x 0.6 x 0.5 cm soft anthracotic nodule. The specimen is sectioned and entirely submitted in one cassette. 3. Received fresh are 1.2 x 1.0 x 0.3 cm of soft anthracotic tissue. The specimen is entirely submitted in one cassette. 4. Received fresh is a 0.7 x 0.6 x 0.4 cm portion of soft anthracotic tissue. The specimen is sectioned and entirely submitted in one cassette. 5. Received fresh is a 0.6 x 0.4 x 0.2 cm portion of soft anthracotic tissue. The specimen is submitted in toto. 6. Received fresh are 0.5 x 0.3 x 0.2 cm of soft anthracotic tissue. The specimen is entirely submitted in one cassette. (GP:ah 05/03/18) Report signed out from the following location(s) Technical component and interpretation was performed at [facility], [place], [place] [address]. CLIA #: [id number], 3 of   Allergies as of 05/09/2018      Reactions   Clarithromycin Nausea Only      Medication List    TAKE these medications   amLODipine 10 MG tablet Commonly known as:  NORVASC Take 1 tablet (10 mg total) by mouth daily.   ASPIRIN LOW DOSE 81 MG tablet Generic drug:  aspirin Take 81 mg by  mouth daily at 2 PM.   atorvastatin 40 MG tablet Commonly known as:  LIPITOR TAKE 1 TABLET DAILY What changed:    how much to take  how to take this  when to take this   budesonide 32 MCG/ACT nasal spray Commonly known as:  RHINOCORT AQUA Place 1 spray into both nostrils daily. What changed:    when to take this  reasons to take this   estrogens (conjugated) 0.3 MG tablet Commonly known as:  PREMARIN Take 0.3 mg by mouth daily at 2 PM.   levothyroxine 75 MCG tablet Commonly known as:  SYNTHROID, LEVOTHROID TAKE 1 TABLET DAILY What changed:    how much to take  how to take this  when to take this   lisinopril 40 MG tablet Commonly known as:  PRINIVIL,ZESTRIL TAKE 1 TABLET (40 MG TOTAL) DAILY What changed:  See the new instructions.   meclizine 25 MG tablet Commonly known as:  ANTIVERT TAKE 1/2 TO 1 TABLET BY MOUTH EVERY 6 HOURS AS NEEDED What changed:    how much to take  how to take this  when to take this  reasons to take this  additional instructions   montelukast 10 MG tablet Commonly known as:  SINGULAIR TAKE 1 TABLET DAILY  What changed:    how much to take  how to take this  when to take this   traMADol 50 MG tablet Commonly known as:  ULTRAM Take 50 mg by mouth every 4-6 hours PRN severe pain   vitamin B-12 1000 MCG tablet Commonly known as:  CYANOCOBALAMIN Take 1,000 mcg by mouth daily at 2 PM.   Vitamin D3 2000 units capsule Take 2,000 Units by mouth daily at 2 PM.   WOMENS 50+ MULTI VITAMIN/MIN PO Take 1 tablet by mouth daily at 2 PM.      Follow-up Information    Grace Isaac, MD. Go on 05/25/2018.   Specialty:  Cardiothoracic Surgery Why:  PA/LAT CXR to be taken (at [facility] which is on the ground floor in the same building as Dr. Everrett Coombe office) on 05/25/2018 at 1:30 pm Appointment is at 2:00 pm Contact information: [address] [place] [address] [phone]        Biagio Borg, MD. Call.   Specialties:  Internal Medicine, Radiology Why:  for a follow up appointment regarding further blood pressure management Contact information: [place] [address] [phone]           Signed: Nani Skillern  PA-C 05/09/2018, 8:52 AM

## 2018-05-07 NOTE — Discharge Instructions (Signed)
Lung Resection, Care After Refer to this sheet in the next few weeks. These instructions provide you with information on caring for yourself after your procedure. Your health care provider may also give you more specific instructions. Your treatment has been planned according to current medical practices, but problems sometimes occur. Call your health care provider if you have any problems or questions after your procedure. What can I expect after the procedure? After your procedure, it is typical to have the following:  You may feel pain in your chest and throat.  Patients may sometimes shiver or feel nauseous during recovery.  Follow these instructions at home:  You may resume a normal diet and activities as directed by your health care provider.  Do not use any tobacco products, including cigarettes, chewing tobacco, or electronic cigarettes. If you need help quitting, ask your health care provider.  There are many different ways to close and cover an incision, including stitches, skin glue, and adhesive strips. Follow your health care provider's instructions on: ? Incision care. ? Bandage (dressing) changes and removal. ? Incision closure removal.  Take medicines only as directed by your health care provider.  Keep all follow-up visits as directed by your health care provider. This is important.  Try to breathe deeply and cough as directed. Holding a pillow firmly over your ribs may help with discomfort.  If you were given an incentive spirometer in the hospital, continue to use it as directed by your health care provider.  Walk as directed by your health care provider.  You may take a shower and gently wash the area of your incision with water and soap as directed by your health care provider. Do not use anything else to clean your incision except as directed by your health care provider. Do not take baths, swim, or use a hot tub until your health care provider approves. Contact a  health care provider if:  You notice redness, swelling, or increasing pain at the incision site.  You are bleeding at the incision site.  You see pus coming from the incision site.  You notice a bad smell coming from the incision site or bandage.  Your incision breaks open.  You cough up blood or pus, or you develop a cough that produces bad-smelling sputum.  You have pain or swelling in your legs.  You have increasing pain that is not controlled with medicine.  You have trouble managing any of the tubes that have been left in place after surgery.  You have fever or chills. Get help right away if:  You have chest pain or an irregular or rapid heartbeat.  You have dizzy episodes or faint.  You have shortness of breath or difficulty breathing.  You have persistent nausea or vomiting.  You have a rash. This information is not intended to replace advice given to you by your health care provider. Make sure you discuss any questions you have with your health care provider. Document Released: 07/04/2005 Document Revised: 05/22/2016 Document Reviewed: 02/03/2014 Elsevier Interactive Patient Education  2018 [employer].

## 2018-05-07 NOTE — Progress Notes (Signed)
Chest tube & on-Q removed. Pt tolerated well. Denies pain. New dressing c/d/i. Will continue to monitor.

## 2018-05-07 NOTE — Care Management Important Message (Signed)
Important Message  Patient Details  Name: [name] MRN: [id number] Date of Birth: 1933/01/25   Medicare Important Message Given:  Yes    Cheney Ewart P Gasquet 05/07/2018, 1:18 PM

## 2018-05-07 NOTE — Progress Notes (Addendum)
[address]       [place], [address]             [phone]      4 Days Post-Op Procedure(s) (LRB): VIDEO BRONCHOSCOPY (N/A) VIDEO ASSISTED THORACOSCOPY (VATS)/LUNG RESECTION with insertion of ON-Q pump (Right) NODE DISSECTION (Right) RIGHT MIDDLE LOBECTOMY (Right) Subjective: Feeling better, no nausea or dizziness. BP is better controlled with addition of norvasc  Objective: Vital signs in last 24 hours: Temp:  [97.5 F (36.4 C)-98.4 F (36.9 C)] 98.4 F (36.9 C) (05/10 0432) Pulse Rate:  [65-73] 65 (05/10 0432) Cardiac Rhythm: Normal sinus rhythm (05/10 0440) Resp:  [11-20] 11 (05/10 0432) BP: (134-191)/(75-97) 149/84 (05/10 0432) SpO2:  [95 %-98 %] 95 % (05/10 0432) Weight:  [59.6 kg (131 lb 6.3 oz)] 59.6 kg (131 lb 6.3 oz) (05/10 0432)  Hemodynamic parameters for last 24 hours:    Intake/Output from previous day: 05/09 0701 - 05/10 0700 In: 2590 [P.O.:600; I.V.:1990] Out: 1350 [Urine:1300; Chest Tube:50] Intake/Output this shift: No intake/output data recorded.  General appearance: alert, cooperative and no distress Heart: regular rate and rhythm Lungs: mildly dim in lower fields Abdomen: benign Extremities: no edema or calf tenderness Wound: healing well  Lab Results: Recent Labs    05/04/18 0755 05/05/18 0258  WBC 13.4* 14.6*  HGB 11.3* 11.9*  HCT 34.2* 36.4  PLT 181 196   BMET:  Recent Labs    05/05/18 0258 05/07/18 0445  NA 141 143  K 3.6 3.2*  CL 106 105  CO2 28 32  GLUCOSE 121* 100*  BUN 8 8  CREATININE 0.70 0.78  CALCIUM 9.0 8.8*    PT/INR: No results for input(s): LABPROT, INR in the last 72 hours. ABG    Component Value Date/Time   PHART 7.494 (H) 04/29/2018 1323   HCO3 24.8 04/29/2018 1323   O2SAT 96.8 04/29/2018 1323   CBG (last 3)  Recent Labs    05/06/18 1948 05/07/18 0006 05/07/18 0514  GLUCAP 119* 89 107*    Meds Scheduled Meds: . acetaminophen  1,000 mg Oral Q6H   Or  .  acetaminophen (TYLENOL) oral liquid 160 mg/5 mL  1,000 mg Oral Q6H  . amLODipine  5 mg Oral Daily  . atorvastatin  40 mg Oral Daily  . bisacodyl  10 mg Oral Daily  . enoxaparin (LOVENOX) injection  30 mg Subcutaneous Q24H  . insulin aspart  0-24 Units Subcutaneous Q4H  . lactulose  20 g Oral Once  . levothyroxine  75 mcg Oral QAC breakfast  . lisinopril  40 mg Oral Daily  . montelukast  10 mg Oral Daily  . senna-docusate  1 tablet Oral QHS  . white petrolatum       Continuous Infusions: . dextrose 5 % and 0.45% NaCl 50 mL/hr at 05/05/18 1500  . potassium chloride     PRN Meds:.fentaNYL (SUBLIMAZE) injection, fluticasone, meclizine, oxyCODONE, potassium chloride, promethazine, traMADol  Xrays Dg Chest Port 1 View  Result Date: 05/06/2018 CLINICAL DATA:  Chest tube. EXAM: PORTABLE CHEST 1 VIEW COMPARISON:  Radiograph May 05, 2018. FINDINGS: Stable cardiomediastinal silhouette. Left lung is clear. One right-sided chest tube has been removed. The other is unchanged in position. No definite pneumothorax is noted. Mild loculated pleural effusion or pleural thickening is noted laterally. Right internal jugular catheter is unchanged in position. IMPRESSION: No pneumothorax after removal of 1 right-sided chest tube. The other is unchanged in position. Electronically Signed   By:  Marijo Conception, M.D.   On: 05/06/2018 11:36    Assessment/Plan: S/P Procedure(s) (LRB): VIDEO BRONCHOSCOPY (N/A) VIDEO ASSISTED THORACOSCOPY (VATS)/LUNG RESECTION with insertion of ON-Q pump (Right) NODE DISSECTION (Right) RIGHT MIDDLE LOBECTOMY (Right)  1 Improving overall 2 no air leak today, minimal drainage, CXR is stable- d/c chest tube 3 BP better, will increase norvasc to 10 mg 4 renal fxn normal, replace K+ , decrease IVF rate 5 push pulm toilet and rehab 6 poss home next 1-2 days  LOS: 4 days    John Giovanni 05/07/2018  On q and remaining chest tube out today  I have seen and examined [name] and agree with the above assessment  and plan. Path reviewed with the patient   Cancer Staging Adenocarcinoma, lung, right [facility]) Staging form: Lung, AJCC 8th Edition - Pathologic stage from 05/07/2018: Stage IA1 (pT1a, pN0, cM0) - Signed by Grace Isaac, MD on 05/07/2018     Grace Isaac MD [place] [phone] Office [phone] 05/07/2018 1:21 PM

## 2018-05-07 NOTE — Progress Notes (Signed)
Pt will walk her third time today after she finishes dinner.   [place]  Cassidy Lundblad, RN

## 2018-05-07 NOTE — Progress Notes (Signed)
R IJ removed; pt educated to lay flat for another 30 minutes; tolerated well.   [place]  Trung Wenzl, RN

## 2018-05-08 ENCOUNTER — Inpatient Hospital Stay (HOSPITAL_COMMUNITY): Payer: Medicare Other

## 2018-05-08 LAB — CBC
HCT: 35 % — ABNORMAL LOW (ref 36.0–46.0)
Hemoglobin: 11.2 g/dL — ABNORMAL LOW (ref 12.0–15.0)
MCH: 30.4 pg (ref 26.0–34.0)
MCHC: 32 g/dL (ref 30.0–36.0)
MCV: 95.1 fL (ref 78.0–100.0)
Platelets: 232 10*3/uL (ref 150–400)
RBC: 3.68 MIL/uL — ABNORMAL LOW (ref 3.87–5.11)
RDW: 14.5 % (ref 11.5–15.5)
WBC: 6.4 10*3/uL (ref 4.0–10.5)

## 2018-05-08 LAB — GLUCOSE, CAPILLARY
Glucose-Capillary: 107 mg/dL — ABNORMAL HIGH (ref 65–99)
Glucose-Capillary: 98 mg/dL (ref 65–99)
Glucose-Capillary: 92 mg/dL (ref 65–99)

## 2018-05-08 LAB — BASIC METABOLIC PANEL
Anion gap: 7 (ref 5–15)
BUN: 9 mg/dL (ref 6–20)
CO2: 31 mmol/L (ref 22–32)
Calcium: 9 mg/dL (ref 8.9–10.3)
Chloride: 103 mmol/L (ref 101–111)
Creatinine, Ser: 0.93 mg/dL (ref 0.44–1.00)
GFR calc Af Amer: >60 mL/min (ref >60)
GFR calc non Af Amer: 55 mL/min — ABNORMAL LOW (ref >60)
Glucose, Bld: 100 mg/dL — ABNORMAL HIGH (ref 65–99)
Potassium: 3.8 mmol/L (ref 3.5–5.1)
Sodium: 141 mmol/L (ref 135–145)

## 2018-05-08 MED ORDER — TRAMADOL HCL 50 MG PO TABS: ORAL_TABLET | ORAL | 0 refills | Status: DC

## 2018-05-08 MED ORDER — POTASSIUM CHLORIDE CRYS ER 20 MEQ PO TBCR
20.0000 meq | EXTENDED_RELEASE_TABLET | Freq: Once | ORAL | Status: AC
  Administered 2018-05-08: 20 meq via ORAL
  Filled 2018-05-08: qty 1

## 2018-05-08 MED ORDER — FUROSEMIDE 40 MG PO TABS
40.0000 mg | ORAL_TABLET | Freq: Once | ORAL | Status: AC
  Administered 2018-05-08: 40 mg via ORAL
  Filled 2018-05-08: qty 1

## 2018-05-08 MED ORDER — AMLODIPINE BESYLATE 10 MG PO TABS: 10.0000 mg | ORAL_TABLET | Freq: Every day | ORAL | 1 refills | Status: DC

## 2018-05-08 NOTE — Progress Notes (Signed)
Heard loud noise from pt room.  Found pt on floor beside bedside commode.  Pt stated she attempted to get oob to [facility] by herself and tripped.  Pt states "I thought I could do it, that's why I didn't call." Pt denies dizziness or lightheadness prior to fall.  Pt denies pain.  No bruising, bleeding, swelling, or discoloration noted to extremities, trunk, or head.  Pt states "I think I hit the back of my head."  Pt denies pain to site of impact on head.  Pt states "I am fine."  No deformity noted on palpation to head.  Frequent vitals and neuro checks implemented.  Vital signs obtained prior to moving the patient and are as follows    05/08/18 0039  Vitals  BP (!) 148/85  MAP (mmHg) 105  Pulse Rate 93  ECG Heart Rate 91  Resp (!) 21  Oxygen Therapy  SpO2 94 %   Dr. Prescott Gum notified.  Order received for stat Head CT and continuation of frequent vital checks.

## 2018-05-08 NOTE — Progress Notes (Addendum)
      [address]       [place] [address]             [phone]       5 Days Post-Op Procedure(s) (LRB): VIDEO BRONCHOSCOPY (N/A) VIDEO ASSISTED THORACOSCOPY (VATS)/LUNG RESECTION with insertion of ON-Q pump (Right) NODE DISSECTION (Right) RIGHT MIDDLE LOBECTOMY (Right)  Subjective: Events of last evening noted. Patient tripped and fell. She did hit the back of her head. CT head showed no acute finding. Patient without complaints. She wants to go home.  Objective: Vital signs in last 24 hours: Temp:  [98 F (36.7 C)-98.6 F (37 C)] 98.3 F (36.8 C) (05/11 0805) Pulse Rate:  [60-93] 70 (05/11 0805) Cardiac Rhythm: Normal sinus rhythm (05/11 0820) Resp:  [10-21] 15 (05/11 0805) BP: (108-161)/(59-122) 157/76 (05/11 0805) SpO2:  [93 %-100 %] 98 % (05/11 0805)      Intake/Output from previous day: 05/10 0701 - 05/11 0700 In: 1011.7 [P.O.:480; I.V.:531.7] Out: 1200 [Urine:1200]   Physical Exam:  Cardiovascular: RRR Pulmonary: Clear to auscultation bilaterally Abdomen: Soft, non tender, bowel sounds present. Extremities:No  lower extremity edema. Wounds: Clean and dry.  No erythema or signs of infection.   Lab Results: CBC: Recent Labs    05/08/18 0226  WBC 6.4  HGB 11.2*  HCT 35.0*  PLT 232   BMET:  Recent Labs    05/07/18 0445 05/08/18 0226  NA 143 141  K 3.2* 3.8  CL 105 103  CO2 32 31  GLUCOSE 100* 100*  BUN 8 9  CREATININE 0.78 0.93  CALCIUM 8.8* 9.0    PT/INR: No results for input(s): LABPROT, INR in the last 72 hours. ABG:  INR: Will add last result for INR, ABG once components are confirmed Will add last 4 CBG results once components are confirmed  Assessment/Plan:  1. CV - SR and hypertensive with SBP in the 140-150's. She is on her home dose of Lisinopril. Of 40 mg daily and she was started on Amlodipine 10 mg daily. She will need to follow up with medical doctor after discharge. 2.  Pulmonary - On room air. CXR  this am shows  patient rotated to the left, small right pleural effusion and atelectasis, trace right apical pneumothorax. Encourage incentive spirometer. 3. CBGs 100/107/98. No history of diabetes. Stop accu checks 4. As discussed with Dr. Prescott Gum, discharge in am  [place] 05/08/2018,9:23 AM   [facility] getting from bed to bathroom CT scan neuro exams unremarkable DC home tomorrow  patient examined and medical record reviewed,agree with above note. Tharon Aquas Trigt III 05/08/2018

## 2018-05-09 NOTE — Progress Notes (Signed)
Pt discharging to home, all discharge instructions reviewed with patient with pts daughter at bedside, both who asked appropriate questions, all questions were answered to their satisfaction. Pt was able to repeat back instructions. Prescriptions for Norvasc and tramadol given to pt. No further needs at this time.

## 2018-05-09 NOTE — Progress Notes (Signed)
      [address]       [place] [address]             [phone]       6 Days Post-Op Procedure(s) (LRB): VIDEO BRONCHOSCOPY (N/A) VIDEO ASSISTED THORACOSCOPY (VATS)/LUNG RESECTION with insertion of ON-Q pump (Right) NODE DISSECTION (Right) RIGHT MIDDLE LOBECTOMY (Right)  Subjective: Patient without complaints this am. She wants to go home.  Objective: Vital signs in last 24 hours: Temp:  [98 F (36.7 C)-98.6 F (37 C)] 98.4 F (36.9 C) (05/12 0335) Pulse Rate:  [70-80] 71 (05/12 0651) Cardiac Rhythm: Normal sinus rhythm (05/11 2109) Resp:  [12-18] 13 (05/12 0651) BP: (109-157)/(65-91) 113/89 (05/12 0651) SpO2:  [93 %-100 %] 99 % (05/12 0651) Weight:  [125 lb 9.6 oz (57 kg)] 125 lb 9.6 oz (57 kg) (05/12 0607)      Intake/Output from previous day: 05/11 0701 - 05/12 0700 In: 770 [P.O.:770] Out: 1650 [Urine:1650]   Physical Exam:  Cardiovascular: RRR Pulmonary: Clear to auscultation bilaterally Abdomen: Soft, non tender, bowel sounds present. Extremities:No  lower extremity edema. Wounds: Clean and dry.  No erythema or signs of infection. Neuro: Intact without focal deficits  Lab Results: CBC: Recent Labs    05/08/18 0226  WBC 6.4  HGB 11.2*  HCT 35.0*  PLT 232   BMET:  Recent Labs    05/07/18 0445 05/08/18 0226  NA 143 141  K 3.2* 3.8  CL 105 103  CO2 32 31  GLUCOSE 100* 100*  BUN 8 9  CREATININE 0.78 0.93  CALCIUM 8.8* 9.0    PT/INR: No results for input(s): LABPROT, INR in the last 72 hours. ABG:  INR: Will add last result for INR, ABG once components are confirmed Will add last 4 CBG results once components are confirmed  Assessment/Plan:  1. CV - SR . She is on her home dose of Lisinopril at 40 mg daily and she was started on Amlodipine 10 mg daily for previous hypertension. She will need to follow up with medical doctor after discharge. 2.  Pulmonary - On room air.Encourage incentive spirometer. 3. Will  discharge today  Maxx Pham M ZimmermanPA-C 05/09/2018,7:04 AM

## 2018-05-10 ENCOUNTER — Telehealth: Payer: Self-pay | Admitting: *Deleted

## 2018-05-10 NOTE — Telephone Encounter (Signed)
Transition Care Management Follow-up Telephone Call   Date discharged? 05/09/18   How have you been since you were released from the hospital? Pt states she states she is doing ok   Do you understand why you were in the hospital? YES   Do you understand the discharge instructions? YES   Where were you discharged to? Home   Items Reviewed:  Medications reviewed: YES  Allergies reviewed: YES  Dietary changes reviewed: NO  Referrals reviewed: YES, appt w/ Dr. Servando Snare schedule for 05/25/18   Functional Questionnaire:   Activities of Daily Living (ADLs):   She states she are independent in the following: bathing and hygiene, feeding, continence, grooming, toileting and dressing States she  require assistance with the following: ambulation   Any transportation issues/concerns?: NO   Any patient concerns? NO   Confirmed importance and date/time of follow-up visits scheduled YES, appt 05/17/18  Provider Appointment booked with Dr. Jenny Reichmann   Confirmed with patient if condition begins to worsen call PCP or go to the ER.  Patient was given the office number and encouraged to call back with question or concerns.  : YES

## 2018-05-12 ENCOUNTER — Other Ambulatory Visit: Payer: Self-pay | Admitting: Internal Medicine

## 2018-05-17 ENCOUNTER — Ambulatory Visit (INDEPENDENT_AMBULATORY_CARE_PROVIDER_SITE_OTHER): Payer: Medicare Other | Admitting: Internal Medicine

## 2018-05-17 ENCOUNTER — Encounter: Payer: Self-pay | Admitting: Internal Medicine

## 2018-05-17 VITALS — BP 102/64 | HR 65 | Temp 98.6°F | Ht 60.0 in | Wt 126.0 lb

## 2018-05-17 DIAGNOSIS — J309 Allergic rhinitis, unspecified: Secondary | ICD-10-CM

## 2018-05-17 DIAGNOSIS — I10 Essential (primary) hypertension: Secondary | ICD-10-CM

## 2018-05-17 DIAGNOSIS — C3491 Malignant neoplasm of unspecified part of right bronchus or lung: Secondary | ICD-10-CM
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
## 2018-05-17 NOTE — Assessment & Plan Note (Signed)
Ok for allegra prn,  to f/u any worsening symptoms or concerns

## 2018-05-17 NOTE — Assessment & Plan Note (Signed)
For surgical f/u as planned

## 2018-05-17 NOTE — Patient Instructions (Addendum)
OK to take HALF of your amlodipine and lsinopril for 3 weeks  Please only increase again if your BP becomes > 130/90 again  Please continue all other medications as before, and refills have been done if requested.  Please have the pharmacy call with any other refills you may need.  Please keep your appointments with your specialists as you may have planned  Please return in 6 months, or sooner if needed

## 2018-05-17 NOTE — Assessment & Plan Note (Signed)
S/p recent surgury, now overcontrolled, for HALF BP med x 3 wks and reassess BP

## 2018-05-19 ENCOUNTER — Other Ambulatory Visit: Payer: Self-pay | Admitting: Cardiothoracic Surgery

## 2018-05-19 DIAGNOSIS — C3490 Malignant neoplasm of unspecified part of unspecified bronchus or lung: Secondary | ICD-10-CM

## 2018-05-25 ENCOUNTER — Ambulatory Visit
Admission: RE | Admit: 2018-05-25 | Discharge: 2018-05-25 | Disposition: A | Discharge status: Home / Self Care | Payer: Medicare Other | Source: Ambulatory Visit | Attending: Cardiothoracic Surgery | Admitting: Cardiothoracic Surgery

## 2018-05-25 ENCOUNTER — Encounter: Payer: Self-pay | Admitting: Physician Assistant

## 2018-05-25 ENCOUNTER — Ambulatory Visit (INDEPENDENT_AMBULATORY_CARE_PROVIDER_SITE_OTHER): Payer: Self-pay | Admitting: Physician Assistant

## 2018-05-25 VITALS — BP 92/58 | HR 68 | Resp 20 | Ht 60.0 in | Wt 124.0 lb

## 2018-05-25 DIAGNOSIS — C3490 Malignant neoplasm of unspecified part of unspecified bronchus or lung: Secondary | ICD-10-CM

## 2018-05-25 DIAGNOSIS — C3491 Malignant neoplasm of unspecified part of right bronchus or lung: Secondary | ICD-10-CM | POA: Diagnosis not present

## 2018-05-25 DIAGNOSIS — C342 Malignant neoplasm of middle lobe, bronchus or lung: Secondary | ICD-10-CM

## 2018-05-25 DIAGNOSIS — Z902 Acquired absence of lung [part of]: Secondary | ICD-10-CM
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
## 2018-05-25 NOTE — Patient Instructions (Signed)
You are encouraged to enroll and participate in the outpatient cardiac rehab program beginning as soon as practical.  You may return to driving an automobile as long as you are no longer requiring oral narcotic pain relievers during the daytime.  It would be wise to start driving only short distances during the daylight and gradually increase from there as you feel comfortable.  Make every effort to stay physically active, get some type of exercise on a regular basis, and stick to a "heart healthy diet".  The long term benefits for regular exercise and a healthy diet are critically important to your overall health and wellbeing.

## 2018-06-09 ENCOUNTER — Ambulatory Visit (INDEPENDENT_AMBULATORY_CARE_PROVIDER_SITE_OTHER): Payer: Self-pay | Admitting: Cardiothoracic Surgery

## 2018-06-09 ENCOUNTER — Other Ambulatory Visit: Payer: Self-pay

## 2018-06-09 ENCOUNTER — Encounter: Payer: Self-pay | Admitting: Cardiothoracic Surgery

## 2018-06-09 VITALS — BP 150/84 | HR 61 | Resp 18 | Ht 60.0 in | Wt 127.0 lb

## 2018-06-09 DIAGNOSIS — C342 Malignant neoplasm of middle lobe, bronchus or lung: Secondary | ICD-10-CM

## 2018-06-09 NOTE — Progress Notes (Signed)
[address]       [name]             [phone]                  [name] [facility] Medical Record #[id number] Date of Birth: November 04, 1933  Referring [name], Cassidy Cara, MD Primary Cardiology: Primary Care:John, Hunt Oris, MD  Chief Complaint:  Follow Up Visit 05/03/2018 PREOPERATIVE DIAGNOSIS:  Right middle lobe adenocarcinoma of the lung. POSTOPERATIVE DIAGNOSIS:  Right middle lobe adenocarcinoma of the lung. SURGICAL PROCEDURE:  Bronchoscopy, right video-assisted thoracoscopy, right middle lobectomy, lymph node dissection, and placement of On-Q. SURGEON:  Lanelle Bal, MD  Cancer Staging Adenocarcinoma, lung, right [facility]) Staging form: Lung, AJCC 8th Edition - Pathologic stage from 05/07/2018: Stage IA1 (pT1a, pN0, cM0) - Signed by Cassidy Isaac, MD on 05/07/2018   History of Present Illness:      Patient returns the office today in follow-up after right middle lobectomy done approximately 1 month ago.  Patient is making good progress postoperatively.  She notes yesterday she was cleaning rugs in her house.  Has some minor soreness along the right chest but otherwise without any complaint       Zubrod Score: At the time of surgery this patient's most appropriate activity status/level should be described as: [x]     0    Normal activity, no symptoms []     1    Restricted in physical strenuous activity but ambulatory, able to do out light work []     2    Ambulatory and capable of self care, unable to do work activities, up and about                 >50 % of waking hours                                                                                   []     3    Only limited self care, in bed greater than 50% of waking hours []     4    Completely disabled, no self care, confined to bed or chair []     5    Moribund  Social History   Tobacco Use  Smoking Status Former Smoker  . Years: 3.00  . Last attempt to quit: 12/29/1961    . Years since quitting: 42.4  Smokeless Tobacco Never Used  Tobacco Comment   smoked as teen--1-2 cigs per day       Allergies  Allergen Reactions  . Clarithromycin Nausea Only    Current Outpatient Medications  Medication Sig Dispense Refill  . aspirin (ASPIRIN LOW DOSE) 81 MG tablet Take 81 mg by mouth daily at 2 PM.     . atorvastatin (LIPITOR) 40 MG tablet TAKE 1 TABLET DAILY (Patient taking differently: TAKE 1 TABLET DAILY MIDDAY) 90 tablet 2  . budesonide (RHINOCORT AQUA) 32 MCG/ACT nasal spray Place 1 spray into both nostrils daily. (Patient taking differently: Place 1 spray into both nostrils daily as needed for allergies. ) 25.8 g 1  . Cholecalciferol (VITAMIN D3) 2000 UNITS capsule Take 2,000  Units by mouth daily at 2 PM.     . levothyroxine (SYNTHROID, LEVOTHROID) 75 MCG tablet TAKE 1 TABLET DAILY (Patient taking differently: TAKE 1 TABLET DAILY MIDDAY) 90 tablet 3  . lisinopril (PRINIVIL,ZESTRIL) 40 MG tablet TAKE 1 TABLET (40 MG TOTAL) DAILY 90 tablet 1  . meclizine (ANTIVERT) 25 MG tablet TAKE 1/2 TO 1 TABLET BY MOUTH EVERY 6 HOURS AS NEEDED (Patient taking differently: Take 12.5-25 mg by mouth every 6 (six) hours as needed. TAKE 1/2 TO 1 TABLET BY MOUTH EVERY 6 HOURS AS NEEDED) 60 tablet 0  . montelukast (SINGULAIR) 10 MG tablet TAKE 1 TABLET DAILY (Patient taking differently: TAKE 1 TABLET DAILY MIDDAY) 90 tablet 3  . Multiple Vitamins-Minerals (WOMENS 50+ MULTI VITAMIN/MIN PO) Take 1 tablet by mouth daily at 2 PM.     . PREMARIN 0.3 MG tablet TAKE 1 TABLET DAILY 90 tablet 1  . vitamin B-12 (CYANOCOBALAMIN) 1000 MCG tablet Take 1,000 mcg by mouth daily at 2 PM.      No current facility-administered medications for this visit.        Physical Exam: BP (!) 150/84 (BP Location: Right Arm, Patient Position: Sitting, Cuff Size: Normal)   Pulse 61   Resp 18   Ht 5' (1.524 m)   Wt 127 lb (57.6 kg)   SpO2 97% Comment: RA  BMI 24.80 kg/m   General appearance:  alert, cooperative, appears stated age and no distress Neurologic: intact Heart: regular rate and rhythm, S1, S2 normal, no murmur, click, rub or gallop Lungs: clear to auscultation bilaterally Abdomen: soft, non-tender; bowel sounds normal; no masses,  no organomegaly Extremities: extremities normal, atraumatic, no cyanosis or edema and Homans sign is negative, no sign of DVT Wound: Right chest wart sites and incisions are well-healed without evidence of infection Wounds:  Diagnostic Studies & Laboratory data:         Recent Radiology Findings: No results found.   I have independently reviewed the above radiology findings and reviewed findings  with the patient.  Recent Labs: Lab Results  Component Value Date   WBC 6.4 05/08/2018   HGB 11.2 (L) 05/08/2018   HCT 35.0 (L) 05/08/2018   PLT 232 05/08/2018   GLUCOSE 100 (H) 05/08/2018   CHOL 177 02/13/2015   TRIG 113.0 02/13/2015   HDL 73.70 02/13/2015   LDLDIRECT 123.2 12/04/2008   LDLCALC 81 02/13/2015   ALT 16 05/05/2018   AST 34 05/05/2018   NA 141 05/08/2018   K 3.8 05/08/2018   CL 103 05/08/2018   CREATININE 0.93 05/08/2018   BUN 9 05/08/2018   CO2 31 05/08/2018   TSH 1.73 02/13/2015   INR 0.96 04/29/2018      Assessment / Plan:      Stable postoperative course after recent right middle lobectomy for non-small cell carcinoma of the right middle lobe stage I A1.  Patient's case was discussed in multidisciplinary thoracic oncology conference and pathology reviewed, there is no indication for further postoperative therapy other than close observation.  Plan to see the patient back in approximately 2 months with a follow-up chest x-ray with the plan to get a CT scan of the chest 6 months postop.       [name] 06/09/2018 3:34 PM

## 2018-06-28 ENCOUNTER — Other Ambulatory Visit: Payer: Self-pay | Admitting: Internal Medicine

## 2018-08-26 ENCOUNTER — Encounter: Payer: Medicare Other | Admitting: Cardiothoracic Surgery

## 2018-09-09 ENCOUNTER — Ambulatory Visit (INDEPENDENT_AMBULATORY_CARE_PROVIDER_SITE_OTHER): Payer: Medicare Other | Admitting: Cardiothoracic Surgery

## 2018-09-09 ENCOUNTER — Other Ambulatory Visit: Payer: Self-pay | Admitting: *Deleted

## 2018-09-09 ENCOUNTER — Other Ambulatory Visit: Payer: Self-pay

## 2018-09-09 ENCOUNTER — Ambulatory Visit
Admission: RE | Admit: 2018-09-09 | Discharge: 2018-09-09 | Disposition: A | Discharge status: Home / Self Care | Payer: Medicare Other | Source: Ambulatory Visit | Attending: Cardiothoracic Surgery | Admitting: Cardiothoracic Surgery

## 2018-09-09 ENCOUNTER — Encounter: Payer: Self-pay | Admitting: Cardiothoracic Surgery

## 2018-09-09 VITALS — BP 190/100 | HR 58 | Resp 16 | Ht 60.0 in | Wt 127.0 lb

## 2018-09-09 DIAGNOSIS — C342 Malignant neoplasm of middle lobe, bronchus or lung: Secondary | ICD-10-CM | POA: Diagnosis not present

## 2018-09-09 DIAGNOSIS — Z902 Acquired absence of lung [part of]: Secondary | ICD-10-CM

## 2018-09-09 DIAGNOSIS — Z85118 Personal history of other malignant neoplasm of bronchus and lung: Secondary | ICD-10-CM | POA: Diagnosis not present

## 2018-09-09 NOTE — Progress Notes (Signed)
[address]       [place] [address]             [phone]                  Mikel H Thier Vander Medical Record #[id number] Date of Birth: 09-02-1933  Referring WC:BJSE, [name], MD Primary Cardiology: Primary Care:John, Hunt Oris, MD  Chief Complaint:  Follow Up Visit 05/03/2018 PREOPERATIVE DIAGNOSIS:  Right middle lobe adenocarcinoma of the lung. POSTOPERATIVE DIAGNOSIS:  Right middle lobe adenocarcinoma of the lung. SURGICAL PROCEDURE:  Bronchoscopy, right video-assisted thoracoscopy, right middle lobectomy, lymph node dissection, and placement of On-Q. SURGEON:  Lanelle Bal, MD  Cancer Staging Adenocarcinoma, lung, right [facility]) Staging form: Lung, AJCC 8th Edition - Pathologic stage from 05/07/2018: Stage IA1 (pT1a, pN0, cM0) - Signed by Grace Isaac, MD on 05/07/2018   History of Present Illness:      Patient returns the office today in follow-up after right middle lobectomy done 04/2018 .  Patient is making good progress postoperatively.  She spent the last month visiting family in [place] and [place].  She denies any trouble with shortness of breath or cough and is remained active.     Zubrod Score: At the time of surgery this patient's most appropriate activity status/level should be described as: [x]     0    Normal activity, no symptoms []     1    Restricted in physical strenuous activity but ambulatory, able to do out light work []     2    Ambulatory and capable of self care, unable to do work activities, up and about                 >50 % of waking hours                                                                                   []     3    Only limited self care, in bed greater than 50% of waking hours []     4    Completely disabled, no self care, confined to bed or chair []     5    Moribund  Social History   Tobacco Use  Smoking Status Former Smoker  . Years: 3.00  . Last attempt to quit: 12/29/1961  .  Years since quitting: 56.7  Smokeless Tobacco Never Used  Tobacco Comment   smoked as teen--1-2 cigs per day       Allergies  Allergen Reactions  . Clarithromycin Nausea Only    Current Outpatient Medications  Medication Sig Dispense Refill  . aspirin (ASPIRIN LOW DOSE) 81 MG tablet Take 81 mg by mouth daily at 2 PM.     . atorvastatin (LIPITOR) 40 MG tablet TAKE 1 TABLET DAILY (Patient taking differently: TAKE 1 TABLET DAILY MIDDAY) 90 tablet 2  . budesonide (RHINOCORT AQUA) 32 MCG/ACT nasal spray Place 1 spray into both nostrils daily. (Patient taking differently: Place 1 spray into both nostrils daily as needed for allergies. ) 25.8 g 1  . Cholecalciferol (VITAMIN D3) 2000 UNITS capsule Take 2,000 Units by  mouth daily at 2 PM.     . levothyroxine (SYNTHROID, LEVOTHROID) 75 MCG tablet TAKE 1 TABLET DAILY (Patient taking differently: TAKE 1 TABLET DAILY MIDDAY) 90 tablet 3  . lisinopril (PRINIVIL,ZESTRIL) 40 MG tablet TAKE 1 TABLET (40 MG TOTAL) DAILY 90 tablet 1  . meclizine (ANTIVERT) 25 MG tablet TAKE 1/2 TO 1 TABLET BY MOUTH EVERY 6 HOURS AS NEEDED (Patient taking differently: Take 12.5-25 mg by mouth every 6 (six) hours as needed. TAKE 1/2 TO 1 TABLET BY MOUTH EVERY 6 HOURS AS NEEDED) 60 tablet 0  . montelukast (SINGULAIR) 10 MG tablet TAKE 1 TABLET DAILY 90 tablet 0  . Multiple Vitamins-Minerals (WOMENS 50+ MULTI VITAMIN/MIN PO) Take 1 tablet by mouth daily at 2 PM.     . PREMARIN 0.3 MG tablet TAKE 1 TABLET DAILY 90 tablet 1  . vitamin B-12 (CYANOCOBALAMIN) 1000 MCG tablet Take 1,000 mcg by mouth daily at 2 PM.      No current facility-administered medications for this visit.        Physical Exam: BP (!) 190/100 (BP Location: Left Arm, Patient Position: Sitting, Cuff Size: Normal) Comment (Cuff Size): MANUALLY  Pulse (!) 58   Resp 16   Ht 5' (1.524 m)   Wt 127 lb (57.6 kg)   SpO2 97% Comment: ON RA  BMI 24.80 kg/m  Diagnostic Studies & Laboratory data:       General  appearance: alert and cooperative Head: Normocephalic, without obvious abnormality, atraumatic Neck: no adenopathy, no carotid bruit, no JVD, supple, symmetrical, trachea midline and thyroid not enlarged, symmetric, no tenderness/mass/nodules Lymph nodes: Cervical, supraclavicular, and axillary nodes normal. Resp: clear to auscultation bilaterally Back: symmetric, no curvature. ROM normal. No CVA tenderness. Cardio: regular rate and rhythm, S1, S2 normal, no murmur, click, rub or gallop GI: soft, non-tender; bowel sounds normal; no masses,  no organomegaly Extremities: extremities normal, atraumatic, no cyanosis or edema Neurologic: Grossly normal Patient's right chest incision and chest tube sites are well-healed without evidence of infection   recent Radiology Findings: Dg Chest 2 View  Result Date: 09/09/2018 CLINICAL DATA:  Status post right middle lobectomy. EXAM: CHEST - 2 VIEW COMPARISON:  Radiographs of May 25, 2018.  CT scan of March 04, 2018. FINDINGS: The heart size and mediastinal contours are within normal limits. No pneumothorax or pleural effusion is noted. Left lung is clear. Stable postsurgical changes noted in the right midlung. Mild right basilar scarring is noted. No acute pulmonary abnormality is noted. The visualized skeletal structures are unremarkable. IMPRESSION: No active cardiopulmonary disease. Electronically Signed   By: Marijo Conception, M.D.   On: 09/09/2018 13:09     I have independently reviewed the above radiology findings and reviewed findings  with the patient.  Recent Labs: Lab Results  Component Value Date   WBC 6.4 05/08/2018   HGB 11.2 (L) 05/08/2018   HCT 35.0 (L) 05/08/2018   PLT 232 05/08/2018   GLUCOSE 100 (H) 05/08/2018   CHOL 177 02/13/2015   TRIG 113.0 02/13/2015   HDL 73.70 02/13/2015   LDLDIRECT 123.2 12/04/2008   LDLCALC 81 02/13/2015   ALT 16 05/05/2018   AST 34 05/05/2018   NA 141 05/08/2018   K 3.8 05/08/2018   CL 103 05/08/2018     CREATININE 0.93 05/08/2018   BUN 9 05/08/2018   CO2 31 05/08/2018   TSH 1.73 02/13/2015   INR 0.96 04/29/2018      Assessment / Plan:  Patient's case was discussed in multidisciplinary thoracic oncology conference and pathology reviewed, there is no indication for further postoperative therapy other than close observation.  Stable postop course, plan follow-up CT of the chest 6 months postop, 3 months from now       Grace Isaac 09/09/2018 1:23 PM

## 2018-09-24 ENCOUNTER — Telehealth: Payer: Self-pay | Admitting: Internal Medicine

## 2018-09-24 NOTE — Telephone Encounter (Signed)
Copied from [place] [id number]. Topic: Quick Communication - Rx Refill/Question >> Sep 24, 2018  4:18 PM Oliver Pila B wrote: Medication: atorvastatin (LIPITOR) 40 MG tablet [[id number]]   Pt is asking for a partial refill to be sent to her local pharmacy b/c she is completely out; send to [facility] on [address] in [place]  Has the patient contacted their pharmacy? Yes.   (Agent: If no, request that the patient contact the pharmacy for the refill.) (Agent: If yes, when and what did the pharmacy advise?)  Preferred Pharmacy (with phone number or street name): [facility]  Agent: Please be advised that RX refills may take up to 3 business days. We ask that you follow-up with your pharmacy.

## 2018-09-27 MED ORDER — ATORVASTATIN CALCIUM 40 MG PO TABS: 40.0000 mg | ORAL_TABLET | Freq: Every day | ORAL | 1 refills | Status: DC

## 2018-11-08 ENCOUNTER — Other Ambulatory Visit: Payer: Self-pay | Admitting: *Deleted

## 2018-11-08 DIAGNOSIS — Z85118 Personal history of other malignant neoplasm of bronchus and lung: Secondary | ICD-10-CM

## 2018-11-16 ENCOUNTER — Ambulatory Visit: Payer: Medicare Other | Admitting: Internal Medicine

## 2018-11-24 ENCOUNTER — Ambulatory Visit: Payer: Medicare Other | Admitting: Internal Medicine

## 2018-12-09 ENCOUNTER — Other Ambulatory Visit: Payer: Medicare Other

## 2018-12-09 ENCOUNTER — Encounter: Payer: Medicare Other | Admitting: Cardiothoracic Surgery

## 2018-12-16 ENCOUNTER — Ambulatory Visit (INDEPENDENT_AMBULATORY_CARE_PROVIDER_SITE_OTHER): Payer: Medicare Other | Admitting: Cardiothoracic Surgery

## 2018-12-16 ENCOUNTER — Ambulatory Visit
Admission: RE | Admit: 2018-12-16 | Discharge: 2018-12-16 | Disposition: A | Discharge status: Home / Self Care | Payer: Medicare Other | Source: Ambulatory Visit | Attending: Cardiothoracic Surgery | Admitting: Cardiothoracic Surgery

## 2018-12-16 VITALS — BP 140/100 | HR 68 | Resp 20 | Ht 60.0 in | Wt 127.0 lb

## 2018-12-16 DIAGNOSIS — Z85118 Personal history of other malignant neoplasm of bronchus and lung: Secondary | ICD-10-CM | POA: Diagnosis not present

## 2018-12-16 DIAGNOSIS — R918 Other nonspecific abnormal finding of lung field: Secondary | ICD-10-CM | POA: Diagnosis not present

## 2018-12-16 NOTE — Progress Notes (Signed)
[address]       [place] [address]             [phone]                  Shawnie H Wass Dibble Medical Record #[id number] Date of Birth: [birth year]  Referring FA:OZHY, [name], MD Primary Cardiology: Primary Care:John, Hunt Oris, MD  Chief Complaint:  Follow Up Visit 05/03/2018 PREOPERATIVE DIAGNOSIS:  Right middle lobe adenocarcinoma of the lung. POSTOPERATIVE DIAGNOSIS:  Right middle lobe adenocarcinoma of the lung. SURGICAL PROCEDURE:  Bronchoscopy, right video-assisted thoracoscopy, right middle lobectomy, lymph node dissection, and placement of On-Q. SURGEON:  Lanelle Bal, MD  Cancer Staging Adenocarcinoma, lung, right [facility]) Staging form: Lung, AJCC 8th Edition - Pathologic stage from 05/07/2018: Stage IA1 (pT1a, pN0, cM0) - Signed by Grace Isaac, MD on 05/07/2018   History of Present Illness:     Patient returns to the office today in follow-up after right middle lobectomy in May 2019 .  She continues to make good progress postoperatively, she denies shortness of breath cough.  She remains active.  Comes in today approximately 6 months postop with a follow-up CT of the chest.  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    Zubrod Score: At the time of surgery this patient's most appropriate activity status/level should be described as: [x]     0    Normal activity, no symptoms []     1    Restricted in physical strenuous activity but ambulatory, able to do out light work []     2    Ambulatory and capable of self care, unable to do work activities, up and about                 >50 % of waking hours                                                                                   []     3    Only limited self care, in bed greater than 50% of waking hours []     4    Completely disabled, no self care, confined to bed or chair []     5    Moribund  Social History    Tobacco Use  Smoking Status Former Smoker  . Years: 3.00  . Last attempt to quit: 12/29/1961  . Years since quitting: 57.0  Smokeless Tobacco Never Used  Tobacco Comment   smoked as teen--1-2 cigs per day       Allergies  Allergen Reactions  . Clarithromycin Nausea Only    Current Outpatient Medications  Medication Sig Dispense Refill  . aspirin (ASPIRIN LOW DOSE) 81 MG tablet Take 81 mg by mouth daily at 2 PM.     . atorvastatin (LIPITOR) 40 MG tablet Take 1 tablet (40 mg total) by mouth daily. 90 tablet 1  . budesonide (RHINOCORT AQUA) 32 MCG/ACT nasal spray Place 1 spray into both nostrils daily. (Patient taking differently: Place 1 spray into both nostrils daily as needed for allergies. )  25.8 g 1  . Cholecalciferol (VITAMIN D3) 2000 UNITS capsule Take 2,000 Units by mouth daily at 2 PM.     . levothyroxine (SYNTHROID, LEVOTHROID) 75 MCG tablet TAKE 1 TABLET DAILY (Patient taking differently: TAKE 1 TABLET DAILY MIDDAY) 90 tablet 3  . lisinopril (PRINIVIL,ZESTRIL) 40 MG tablet TAKE 1 TABLET (40 MG TOTAL) DAILY 90 tablet 1  . meclizine (ANTIVERT) 25 MG tablet TAKE 1/2 TO 1 TABLET BY MOUTH EVERY 6 HOURS AS NEEDED (Patient taking differently: Take 12.5-25 mg by mouth every 6 (six) hours as needed. TAKE 1/2 TO 1 TABLET BY MOUTH EVERY 6 HOURS AS NEEDED) 60 tablet 0  . montelukast (SINGULAIR) 10 MG tablet TAKE 1 TABLET DAILY 90 tablet 0  . Multiple Vitamins-Minerals (WOMENS 50+ MULTI VITAMIN/MIN PO) Take 1 tablet by mouth daily at 2 PM.     . PREMARIN 0.3 MG tablet TAKE 1 TABLET DAILY 90 tablet 1  . vitamin B-12 (CYANOCOBALAMIN) 1000 MCG tablet Take 1,000 mcg by mouth daily at 2 PM.      No current facility-administered medications for this visit.        Physical Exam: BP (!) 140/100 (BP Location: Left Arm, Cuff Size: Normal)   Pulse 68   Resp 20   Ht 5' (1.524 m)   Wt 127 lb (57.6 kg)   SpO2 97% Comment: RA  BMI 24.80 kg/m  Diagnostic Studies & Laboratory data:       General appearance: alert and cooperative Head: Normocephalic, without obvious abnormality, atraumatic Neck: no adenopathy, no carotid bruit, no JVD, supple, symmetrical, trachea midline and thyroid not enlarged, symmetric, no tenderness/mass/nodules Lymph nodes: Cervical, supraclavicular, and axillary nodes normal. Resp: clear to auscultation bilaterally Cardio: regular rate and rhythm, S1, S2 normal, no murmur, click, rub or gallop GI: soft, non-tender; bowel sounds normal; no masses,  no organomegaly Extremities: extremities normal, atraumatic, no cyanosis or edema and Homans sign is negative, no sign of DVT Neurologic: Grossly normal Incisions on the right chest are well-healed      recent Radiology Findings: Ct Chest Wo Contrast  Result Date: 12/16/2018 CLINICAL DATA:  84 year old female with history of right-sided lung adenocarcinoma diagnosed in April 2019. EXAM: CT CHEST WITHOUT CONTRAST TECHNIQUE: Multidetector CT imaging of the chest was performed following the standard protocol without IV contrast. COMPARISON:  Chest CT 03/04/2018. FINDINGS: Cardiovascular: Heart size is normal. There is no significant pericardial fluid, thickening or pericardial calcification. There is aortic atherosclerosis, as well as atherosclerosis of the great vessels of the mediastinum and the coronary arteries, including calcified atherosclerotic plaque in the left main, left anterior descending, left circumflex and right coronary arteries. Mediastinum/Nodes: No pathologically enlarged mediastinal or hilar lymph nodes. Please note that accurate exclusion of hilar adenopathy is limited on noncontrast CT scans. Esophagus is unremarkable in appearance. No axillary lymphadenopathy. Lungs/Pleura: Status post right middle lobectomy. Compensatory hyperexpansion of the right upper and lower lobes. There continues to be a nodular area of architectural distortion in the medial aspect of the left lower lobe (axial image 126  of series 8), slightly smaller than prior studies (8 mm on today's exam versus 11 mm on 03/04/2018), favored to represent an area of scarring. No other definite suspicious appearing pulmonary nodules or masses are noted. No acute consolidative airspace disease. No pleural effusions. Upper Abdomen: Aortic atherosclerosis. Exophytic low-attenuation lesion in the posterior aspect of the interpolar region of the left kidney, incompletely characterized on today's noncontrast CT examination, but statistically likely a cyst. Musculoskeletal: There  are no aggressive appearing lytic or blastic lesions noted in the visualized portions of the skeleton. IMPRESSION: 1. Status post right middle lobectomy. No definitive findings to suggest metastatic disease in the thorax. 2. Probable nodular area of scarring in the medial aspect of the left lower lobe slightly less apparent than prior studies. Attention on follow-up studies is recommended to ensure continued stability or regression. 3. Aortic atherosclerosis, in addition to left main and 3 vessel coronary artery disease. Aortic Atherosclerosis (ICD10-I70.0). Electronically Signed   By: Vinnie Langton M.D.   On: 12/16/2018 10:48     I have independently reviewed the above radiology findings and reviewed findings  with the patient.  Recent Labs: Lab Results  Component Value Date   WBC 6.4 05/08/2018   HGB 11.2 (L) 05/08/2018   HCT 35.0 (L) 05/08/2018   PLT 232 05/08/2018   GLUCOSE 100 (H) 05/08/2018   CHOL 177 02/13/2015   TRIG 113.0 02/13/2015   HDL 73.70 02/13/2015   LDLDIRECT 123.2 12/04/2008   LDLCALC 81 02/13/2015   ALT 16 05/05/2018   AST 34 05/05/2018   NA 141 05/08/2018   K 3.8 05/08/2018   CL 103 05/08/2018   CREATININE 0.93 05/08/2018   BUN 9 05/08/2018   CO2 31 05/08/2018   TSH 1.73 02/13/2015   INR 0.96 04/29/2018      Assessment / Plan:       Patient's case was discussed in multidisciplinary thoracic oncology conference and pathology  reviewed, there is no indication for further postoperative therapy other than close observation. CT shows no evidence of recurrent disease, reviewed with the patient will have her return in 6 months with a follow-up CT of the chest. She has not had a flu shot this year, I have encouraged her to do so       Grace Isaac 12/16/2018 12:10 PM

## 2019-01-03 ENCOUNTER — Encounter: Payer: Self-pay | Admitting: *Deleted

## 2019-02-11 ENCOUNTER — Ambulatory Visit: Payer: Medicare Other
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
## 2019-02-14 ENCOUNTER — Ambulatory Visit (INDEPENDENT_AMBULATORY_CARE_PROVIDER_SITE_OTHER): Payer: Medicare Other | Admitting: *Deleted

## 2019-02-14 VITALS — BP 144/86 | HR 62 | Resp 17 | Ht 60.0 in | Wt 126.0 lb

## 2019-02-14 DIAGNOSIS — Z0000 Encounter for general adult medical examination without abnormal findings: Secondary | ICD-10-CM

## 2019-02-14 DIAGNOSIS — Z23 Encounter for immunization: Secondary | ICD-10-CM

## 2019-02-14 NOTE — Patient Instructions (Addendum)
Continue doing brain stimulating activities (puzzles, reading, adult coloring books, staying active) to keep memory sharp.   Continue to eat heart healthy diet (full of fruits, vegetables, whole grains, lean protein, water--limit salt, fat, and sugar intake) and increase physical activity as tolerated.   [name] , Thank you for taking time to come for your Medicare Wellness Visit. I appreciate your ongoing commitment to your health goals. Please review the following plan we discussed and let me know if I can assist you in the future.   These are the goals we discussed: Goals    . Patient Stated     Increase how often I exercise from 2 times weekly to 4 times weekly. Increase daily water intake.    . Patient Stated     Increase physical activity by doing my zumba tapes.       This is a list of the screening recommended for you and due dates:  Health Maintenance  Topic Date Due  . Flu Shot  07/29/2018  . Tetanus Vaccine  12/04/2018  . DEXA scan (bone density measurement)  Completed  . Pneumonia vaccines  Completed   Influenza Virus Vaccine injection What is this medicine? INFLUENZA VIRUS VACCINE (in floo EN zuh VAHY ruhs vak SEEN) helps to reduce the risk of getting influenza also known as the flu. The vaccine only helps protect you against some strains of the flu. This medicine may be used for other purposes; ask your health care provider or pharmacist if you have questions. COMMON BRAND NAME(S): Afluria, Agriflu, Alfuria, FLUAD, Fluarix, Fluarix Quadrivalent, Flublok, Flublok Quadrivalent, FLUCELVAX, Flulaval, Fluvirin, Fluzone, Fluzone High-Dose, Fluzone Intradermal What should I tell my health care provider before I take this medicine? They need to know if you have any of these conditions: -bleeding disorder like hemophilia -fever or infection -Guillain-Barre syndrome or other neurological problems -immune system problems -infection with the human immunodeficiency virus (HIV)  or AIDS -low blood platelet counts -multiple sclerosis -an unusual or allergic reaction to influenza virus vaccine, latex, other medicines, foods, dyes, or preservatives. Different brands of vaccines contain different allergens. Some may contain latex or eggs. Talk to your doctor about your allergies to make sure that you get the right vaccine. -pregnant or trying to get pregnant -breast-feeding How should I use this medicine? This vaccine is for injection into a muscle or under the skin. It is given by a health care professional. A copy of Vaccine Information Statements will be given before each vaccination. Read this sheet carefully each time. The sheet may change frequently. Talk to your healthcare provider to see which vaccines are right for you. Some vaccines should not be used in all age groups. Overdosage: If you think you have taken too much of this medicine contact a poison control center or emergency room at once. NOTE: This medicine is only for you. Do not share this medicine with others. What if I miss a dose? This does not apply. What may interact with this medicine? -chemotherapy or radiation therapy -medicines that lower your immune system like etanercept, anakinra, infliximab, and adalimumab -medicines that treat or prevent blood clots like warfarin -phenytoin -steroid medicines like prednisone or cortisone -theophylline -vaccines This list may not describe all possible interactions. Give your health care provider a list of all the medicines, herbs, non-prescription drugs, or dietary supplements you use. Also tell them if you smoke, drink alcohol, or use illegal drugs. Some items may interact with your medicine. What should I watch for while  using this medicine? Report any side effects that do not go away within 3 days to your doctor or health care professional. Call your health care provider if any unusual symptoms occur within 6 weeks of receiving this vaccine. You may  still catch the flu, but the illness is not usually as bad. You cannot get the flu from the vaccine. The vaccine will not protect against colds or other illnesses that may cause fever. The vaccine is needed every year. What side effects may I notice from receiving this medicine? Side effects that you should report to your doctor or health care professional as soon as possible: -allergic reactions like skin rash, itching or hives, swelling of the face, lips, or tongue Side effects that usually do not require medical attention (report to your doctor or health care professional if they continue or are bothersome): -fever -headache -muscle aches and pains -pain, tenderness, redness, or swelling at the injection site -tiredness This list may not describe all possible side effects. Call your doctor for medical advice about side effects. You may report side effects to FDA at 1-800-FDA-1088. Where should I keep my medicine? The vaccine will be given by a health care professional in a clinic, pharmacy, doctor's office, or other health care setting. You will not be given vaccine doses to store at home. NOTE: This sheet is a summary. It may not cover all possible information. If you have questions about this medicine, talk to your doctor, pharmacist, or health care provider.  2019 Elsevier/Gold Standard (2015-07-06 10:07:28) Health Maintenance, Female Adopting a healthy lifestyle and getting preventive care can go a long way to promote health and wellness. Talk with your health care provider about what schedule of regular examinations is right for you. This is a good chance for you to check in with your provider about disease prevention and staying healthy. In between checkups, there are plenty of things you can do on your own. Experts have done a lot of research about which lifestyle changes and preventive measures are most likely to keep you healthy. Ask your health care provider for more  information. Weight and diet Eat a healthy diet  Be sure to include plenty of vegetables, fruits, low-fat dairy products, and lean protein.  Do not eat a lot of foods high in solid fats, added sugars, or salt.  Get regular exercise. This is one of the most important things you can do for your health. ? Most adults should exercise for at least 150 minutes each week. The exercise should increase your heart rate and make you sweat (moderate-intensity exercise). ? Most adults should also do strengthening exercises at least twice a week. This is in addition to the moderate-intensity exercise. Maintain a healthy weight  Body mass index (BMI) is a measurement that can be used to identify possible weight problems. It estimates body fat based on height and weight. Your health care provider can help determine your BMI and help you achieve or maintain a healthy weight.  For females 4 years of age and older: ? A BMI below 18.5 is considered underweight. ? A BMI of 18.5 to 24.9 is normal. ? A BMI of 25 to 29.9 is considered overweight. ? A BMI of 30 and above is considered obese. Watch levels of cholesterol and blood lipids  You should start having your blood tested for lipids and cholesterol at 83 years of age, then have this test every 5 years.  You may need to have your cholesterol levels checked  more often if: ? Your lipid or cholesterol levels are high. ? You are older than 83 years of age. ? You are at high risk for heart disease. Cancer screening Lung Cancer  Lung cancer screening is recommended for adults 25-71 years old who are at high risk for lung cancer because of a history of smoking.  A yearly low-dose CT scan of the lungs is recommended for people who: ? Currently smoke. ? Have quit within the past 15 years. ? Have at least a 30-pack-year history of smoking. A pack year is smoking an average of one pack of cigarettes a day for 1 year.  Yearly screening should continue until  it has been 15 years since you quit.  Yearly screening should stop if you develop a health problem that would prevent you from having lung cancer treatment. Breast Cancer  Practice breast self-awareness. This means understanding how your breasts normally appear and feel.  It also means doing regular breast self-exams. Let your health care provider know about any changes, no matter how small.  If you are in your 20s or 30s, you should have a clinical breast exam (CBE) by a health care provider every 1-3 years as part of a regular health exam.  If you are 10 or older, have a CBE every year. Also consider having a breast X-ray (mammogram) every year.  If you have a family history of breast cancer, talk to your health care provider about genetic screening.  If you are at high risk for breast cancer, talk to your health care provider about having an MRI and a mammogram every year.  Breast cancer gene (BRCA) assessment is recommended for women who have family members with BRCA-related cancers. BRCA-related cancers include: ? Breast. ? Ovarian. ? Tubal. ? Peritoneal cancers.  Results of the assessment will determine the need for genetic counseling and BRCA1 and BRCA2 testing. Cervical Cancer Your health care provider may recommend that you be screened regularly for cancer of the pelvic organs (ovaries, uterus, and vagina). This screening involves a pelvic examination, including checking for microscopic changes to the surface of your cervix (Pap test). You may be encouraged to have this screening done every 3 years, beginning at age 30.  For women ages 16-65, health care providers may recommend pelvic exams and Pap testing every 3 years, or they may recommend the Pap and pelvic exam, combined with testing for human papilloma virus (HPV), every 5 years. Some types of HPV increase your risk of cervical cancer. Testing for HPV may also be done on women of any age with unclear Pap test  results.  Other health care providers may not recommend any screening for nonpregnant women who are considered low risk for pelvic cancer and who do not have symptoms. Ask your health care provider if a screening pelvic exam is right for you.  If you have had past treatment for cervical cancer or a condition that could lead to cancer, you need Pap tests and screening for cancer for at least 20 years after your treatment. If Pap tests have been discontinued, your risk factors (such as having a new sexual partner) need to be reassessed to determine if screening should resume. Some women have medical problems that increase the chance of getting cervical cancer. In these cases, your health care provider may recommend more frequent screening and Pap tests. Colorectal Cancer  This type of cancer can be detected and often prevented.  Routine colorectal cancer screening usually begins at 83 years  of age and continues through 83 years of age.  Your health care provider may recommend screening at an earlier age if you have risk factors for colon cancer.  Your health care provider may also recommend using home test kits to check for hidden blood in the stool.  A small camera at the end of a tube can be used to examine your colon directly (sigmoidoscopy or colonoscopy). This is done to check for the earliest forms of colorectal cancer.  Routine screening usually begins at age 24.  Direct examination of the colon should be repeated every 5-10 years through 83 years of age. However, you may need to be screened more often if early forms of precancerous polyps or small growths are found. Skin Cancer  Check your skin from head to toe regularly.  Tell your health care provider about any new moles or changes in moles, especially if there is a change in a mole's shape or color.  Also tell your health care provider if you have a mole that is larger than the size of a pencil eraser.  Always use sunscreen.  Apply sunscreen liberally and repeatedly throughout the day.  Protect yourself by wearing long sleeves, pants, a wide-brimmed hat, and sunglasses whenever you are outside. Heart disease, diabetes, and high blood pressure  High blood pressure causes heart disease and increases the risk of stroke. High blood pressure is more likely to develop in: ? People who have blood pressure in the high end of the normal range (130-139/85-89 mm Hg). ? People who are overweight or obese. ? People who are African American.  If you are 90-57 years of age, have your blood pressure checked every 3-5 years. If you are 76 years of age or older, have your blood pressure checked every year. You should have your blood pressure measured twice-once when you are at a hospital or clinic, and once when you are not at a hospital or clinic. Record the average of the two measurements. To check your blood pressure when you are not at a hospital or clinic, you can use: ? An automated blood pressure machine at a pharmacy. ? A home blood pressure monitor.  If you are between 18 years and 62 years old, ask your health care provider if you should take aspirin to prevent strokes.  Have regular diabetes screenings. This involves taking a blood sample to check your fasting blood sugar level. ? If you are at a normal weight and have a low risk for diabetes, have this test once every three years after 83 years of age. ? If you are overweight and have a high risk for diabetes, consider being tested at a younger age or more often. Preventing infection Hepatitis B  If you have a higher risk for hepatitis B, you should be screened for this virus. You are considered at high risk for hepatitis B if: ? You were born in a country where hepatitis B is common. Ask your health care provider which countries are considered high risk. ? Your parents were born in a high-risk country, and you have not been immunized against hepatitis B (hepatitis B  vaccine). ? You have HIV or AIDS. ? You use needles to inject street drugs. ? You live with someone who has hepatitis B. ? You have had sex with someone who has hepatitis B. ? You get hemodialysis treatment. ? You take certain medicines for conditions, including cancer, organ transplantation, and autoimmune conditions. Hepatitis C  Blood testing is recommended  for: ? Everyone born from [birth year] through 1963. ? Anyone with known risk factors for hepatitis C. Sexually transmitted infections (STIs)  You should be screened for sexually transmitted infections (STIs) including gonorrhea and chlamydia if: ? You are sexually active and are younger than 83 years of age. ? You are older than 83 years of age and your health care provider tells you that you are at risk for this type of infection. ? Your sexual activity has changed since you were last screened and you are at an increased risk for chlamydia or gonorrhea. Ask your health care provider if you are at risk.  If you do not have HIV, but are at risk, it may be recommended that you take a prescription medicine daily to prevent HIV infection. This is called pre-exposure prophylaxis (PrEP). You are considered at risk if: ? You are sexually active and do not regularly use condoms or know the HIV status of your partner(s). ? You take drugs by injection. ? You are sexually active with a partner who has HIV. Talk with your health care provider about whether you are at high risk of being infected with HIV. If you choose to begin PrEP, you should first be tested for HIV. You should then be tested every 3 months for as long as you are taking PrEP. Pregnancy  If you are premenopausal and you may become pregnant, ask your health care provider about preconception counseling.  If you may become pregnant, take 400 to 800 micrograms (mcg) of folic acid every day.  If you want to prevent pregnancy, talk to your health care provider about birth control  (contraception). Osteoporosis and menopause  Osteoporosis is a disease in which the bones lose minerals and strength with aging. This can result in serious bone fractures. Your risk for osteoporosis can be identified using a bone density scan.  If you are 61 years of age or older, or if you are at risk for osteoporosis and fractures, ask your health care provider if you should be screened.  Ask your health care provider whether you should take a calcium or vitamin D supplement to lower your risk for osteoporosis.  Menopause may have certain physical symptoms and risks.  Hormone replacement therapy may reduce some of these symptoms and risks. Talk to your health care provider about whether hormone replacement therapy is right for you. Follow these instructions at home:  Schedule regular health, dental, and eye exams.  Stay current with your immunizations.  Do not use any tobacco products including cigarettes, chewing tobacco, or electronic cigarettes.  If you are pregnant, do not drink alcohol.  If you are breastfeeding, limit how much and how often you drink alcohol.  Limit alcohol intake to no more than 1 drink per day for nonpregnant women. One drink equals 12 ounces of beer, 5 ounces of Makira Holleman, or 1 ounces of hard liquor.  Do not use street drugs.  Do not share needles.  Ask your health care provider for help if you need support or information about quitting drugs.  Tell your health care provider if you often feel depressed.  Tell your health care provider if you have ever been abused or do not feel safe at home. This information is not intended to replace advice given to you by your health care provider. Make sure you discuss any questions you have with your health care provider. Document Released: 06/30/2011 Document Revised: 05/22/2016 Document Reviewed: 09/18/2015 Elsevier Interactive Patient Education  2019 [employer].
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
## 2019-03-15 ENCOUNTER — Other Ambulatory Visit: Payer: Self-pay | Admitting: Internal Medicine

## 2019-03-28 ENCOUNTER — Other Ambulatory Visit: Payer: Self-pay | Admitting: Internal Medicine

## 2019-03-30 ENCOUNTER — Other Ambulatory Visit: Payer: Self-pay | Admitting: Cardiothoracic Surgery

## 2019-03-30 DIAGNOSIS — C3490 Malignant neoplasm of unspecified part of unspecified bronchus or lung: Secondary | ICD-10-CM

## 2019-04-20 ENCOUNTER — Ambulatory Visit (INDEPENDENT_AMBULATORY_CARE_PROVIDER_SITE_OTHER): Payer: Medicare Other | Admitting: Internal Medicine

## 2019-04-20 ENCOUNTER — Encounter: Payer: Self-pay | Admitting: Internal Medicine

## 2019-04-20 VITALS — Ht 60.0 in

## 2019-04-20 DIAGNOSIS — I10 Essential (primary) hypertension: Secondary | ICD-10-CM | POA: Diagnosis not present

## 2019-04-20 DIAGNOSIS — R002 Palpitations: Secondary | ICD-10-CM

## 2019-04-20 DIAGNOSIS — R42 Dizziness and giddiness: Secondary | ICD-10-CM

## 2019-04-20 MED ORDER — MECLIZINE HCL 25 MG PO TABS: ORAL_TABLET | ORAL | 5 refills | Status: DC

## 2019-04-20 MED ORDER — MECLIZINE HCL 25 MG PO TABS: ORAL_TABLET | ORAL | 5 refills | Status: AC

## 2019-04-20 MED ORDER — METOPROLOL SUCCINATE ER 25 MG PO TB24: 25.0000 mg | ORAL_TABLET | Freq: Every day | ORAL | 3 refills | Status: DC

## 2019-04-20 NOTE — Assessment & Plan Note (Signed)
[place] for meclizine refill,  to f/u any worsening symptoms or concerns

## 2019-04-20 NOTE — Assessment & Plan Note (Signed)
Etiology unclear, cannot do ecg today as she is at home, for holter monitor and labs as ordered, toprol XL as above,  to f/u any worsening symptoms or concerns
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
## 2019-04-20 NOTE — Assessment & Plan Note (Signed)
Mild uncontrolled, for toprol xl 25 qd, consider increase to 50 if tolerates ok

## 2019-04-20 NOTE — Patient Instructions (Addendum)
Please take all new medication as prescribed - the toprol XL 25 mg, which can help with blood pressure, as well as palpitations  Please continue all other medications as before, and refills have been done if requested - the meclizine  Please have the pharmacy call with any other refills you may need.  Please continue your efforts at being more active, low cholesterol diet, and weight control.  Please keep your appointments with your specialists as you may have planned  You will be contacted regarding the referral for: Heart Monitor  Please go to the LAB in the Basement (turn left off the elevator) for the tests to be done today  You will be contacted by phone if any changes need to be made immediately.  Otherwise, you will receive a letter about your results with an explanation, but please check with MyChart first.  Please remember to sign up for MyChart if you have not done so, as this will be important to you in the future with finding out test results, communicating by private email, and scheduling acute appointments online when needed.

## 2019-05-01 IMAGING — CT NM PET TUM IMG INITIAL (PI) SKULL BASE T - THIGH
8 series · 25 of 25 positions shown · non-contrast
Comparison: Chest CT 08/20/2017 and 03/04/2018

CLINICAL DATA: Initial treatment strategy for right middle lobe
ground-glass nodule.

EXAM:
NUCLEAR MEDICINE PET SKULL BASE TO THIGH
TECHNIQUE: 6.48 mCi F-18 FDG was injected intravenously. Full-ring PET imaging
was performed from the skull base to thigh after the radiotracer. CT
data was obtained and used for attenuation correction and anatomic
localization.
Fasting blood glucose: 87 mg/dl
Mediastinal blood pool activity: SUV max

[Series 3: pet sk_thigh ac · axial · 5.0mm · 4.07mm/px · z∈[-968,-164]mm · 4 of 202 slices shown]
[im 1/202]
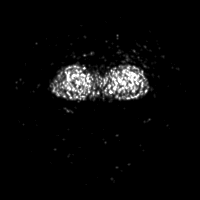
[im 68/202]
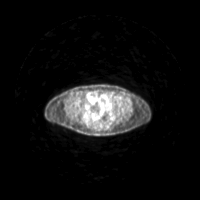
[im 135/202]
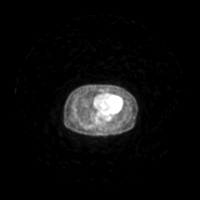
[im 202/202]
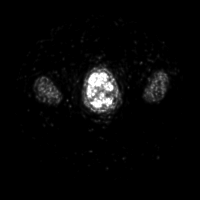

[Series 4: ct sk_thigh 5.0 b31f · axial · 5.0mm · 0.98mm/px · z∈[-968,-164]mm · 5 of 202 slices shown]
[im 1/202]
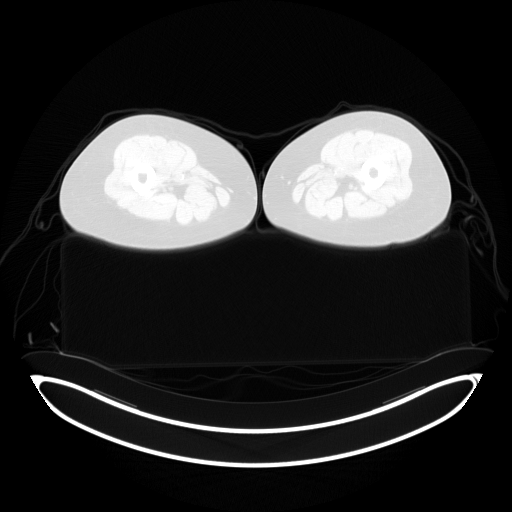
[im 51/202]
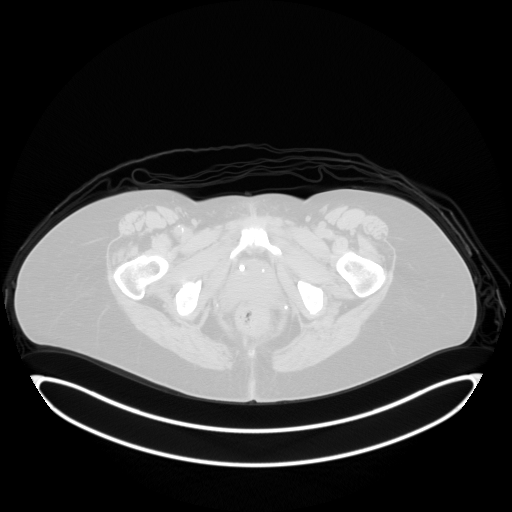
[im 101/202]
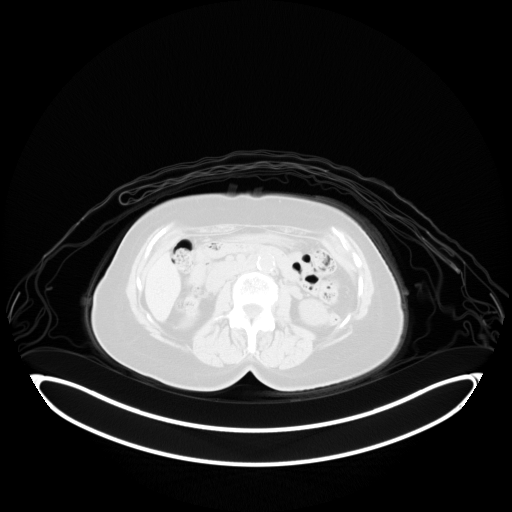
[im 151/202]
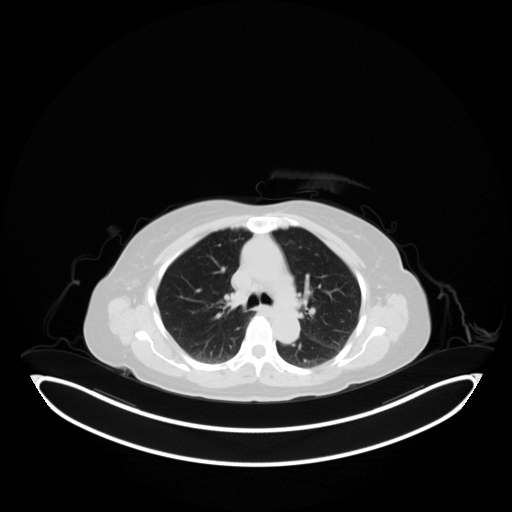
[im 202/202  brain]
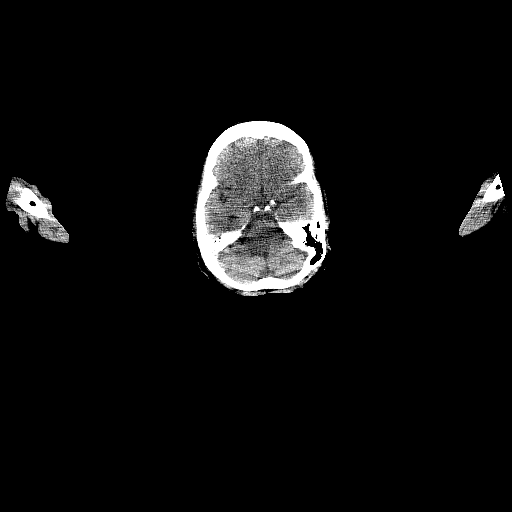

[Series 5: pet sk_thigh nac · axial · 5.0mm · 4.07mm/px · z∈[-968,-164]mm · 5 of 202 slices shown]
[im 1/202]
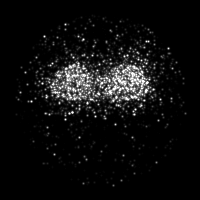
[im 51/202]
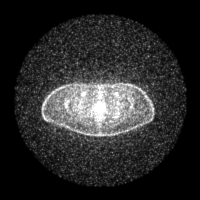
[im 101/202]
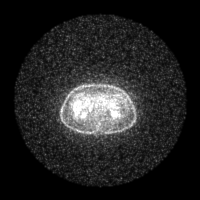
[im 151/202]
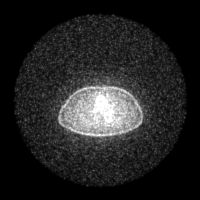
[im 202/202]
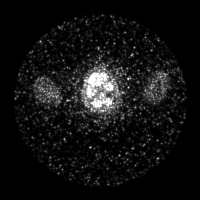

[Series 8: ct sk_thigh 5.0 b70f lung_bone · axial · 5.0mm · 0.49mm/px · z∈[-532,-264]mm · 2 of 68 slices shown]
[im 1/68  bone]
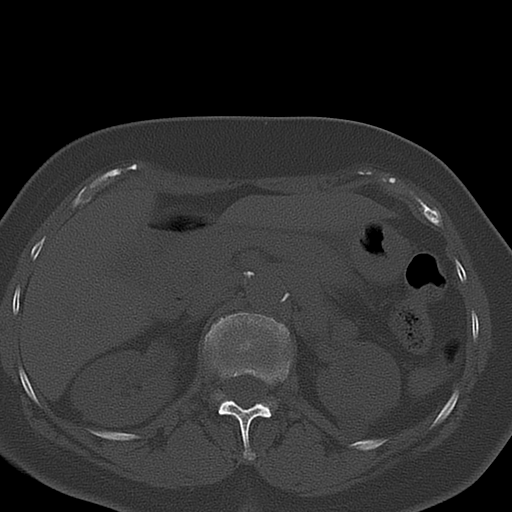
[im 68/68  bone]
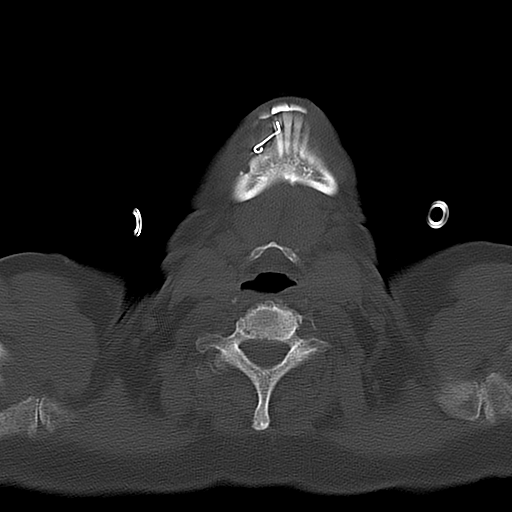

[Series 603: range-ct sk_thigh 5.0 (id)<alpha range> · 2 of 74 slices shown (1 of 2)]
[im 1/74]
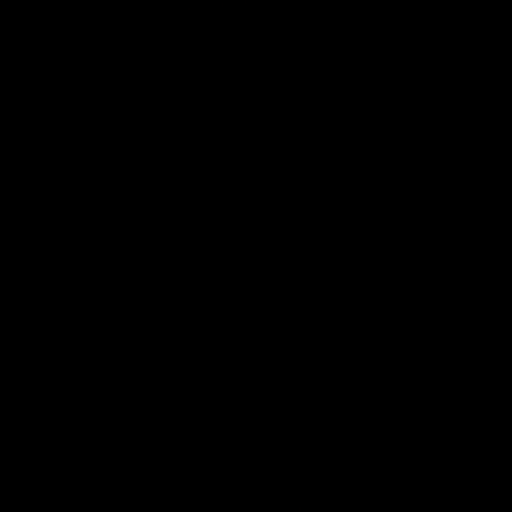
[im 74/74]
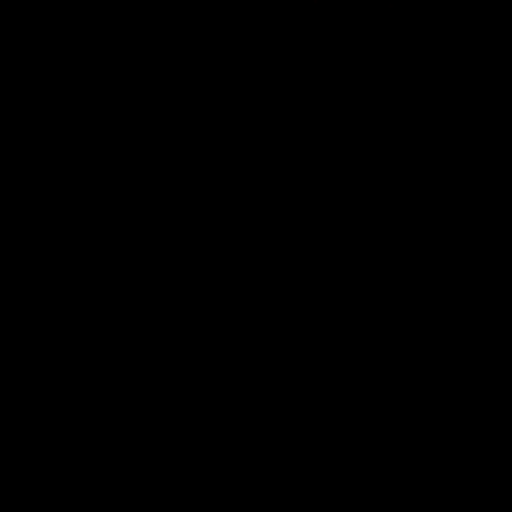

[Series 604: mip range 2 · coronal · 1.68mm/px · 1 of 32 slices shown]
[im 1/32]
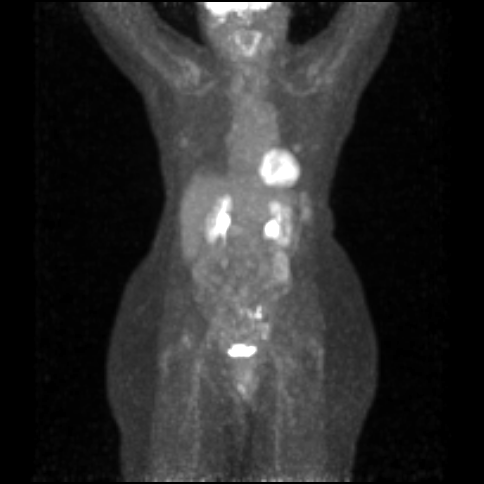

[Series 605: range-ct sk_thigh 5.0 (id)<alpha range> · 5 of 191 slices shown (2 of 2)]
[im 1/191]
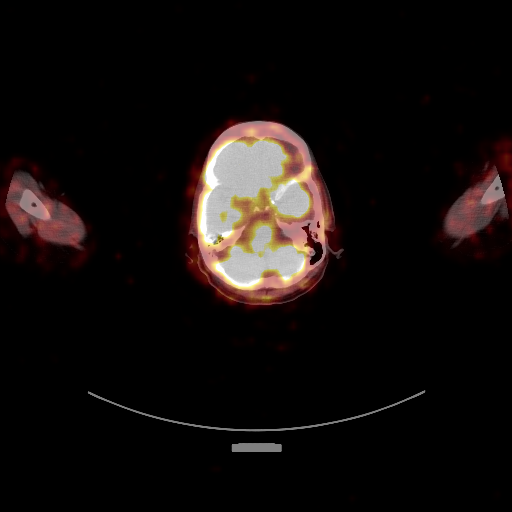
[im 48/191]
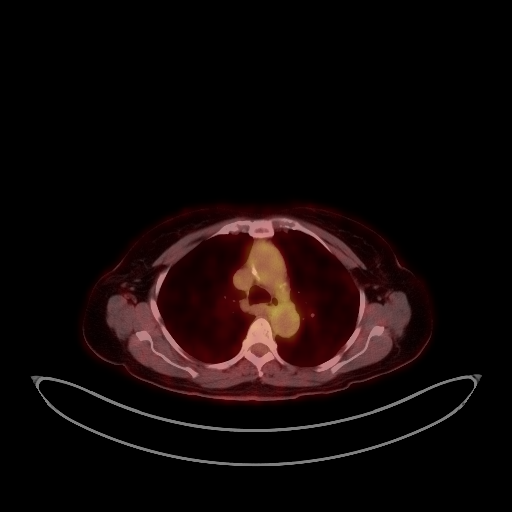
[im 96/191]
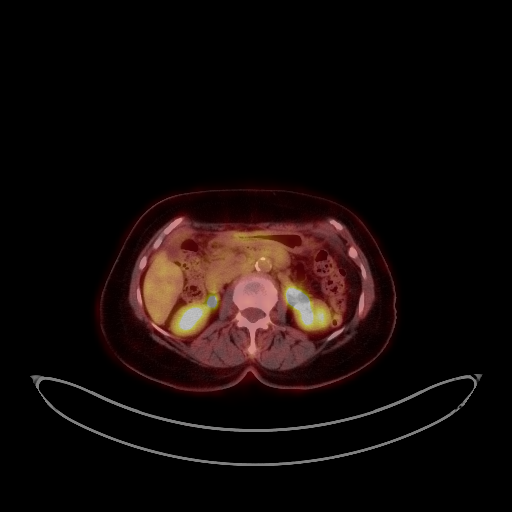
[im 143/191]
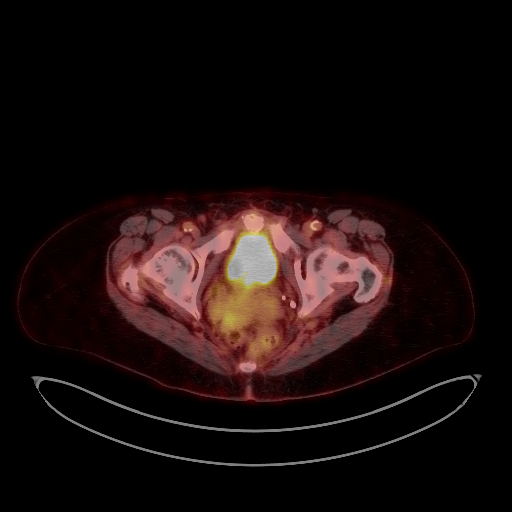
[im 191/191]
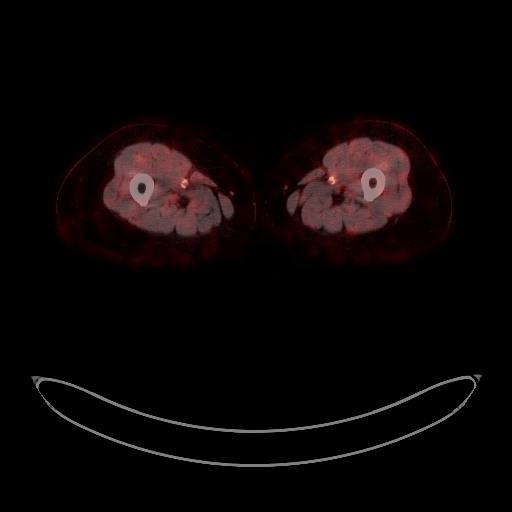

[Series 1084: results mm oncology reading · 1.0mm · 1.00mm/px · 1 of 1 slices shown]
[im 1/1]
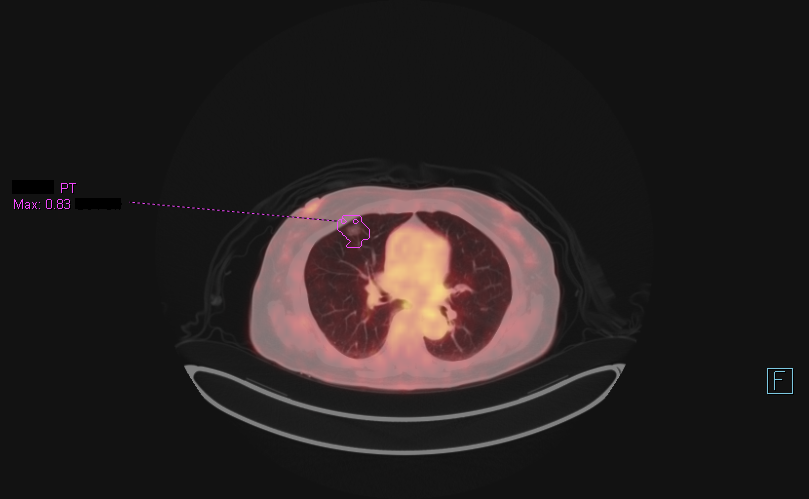

[25 of 25 positions shown; findings below may reference images not displayed]

FINDINGS: NECK: No hypermetabolic lymph nodes in the neck.

Incidental CT findings: none

CHEST: The ground-glass nodule in the right middle lobe measures 16
x 12 mm. No appreciable FDG uptake with SUV max of Zerpa 0.83. This
still could be a slow growing adenocarcinoma or possibly an area of
chronic inflammation. Biopsy versus continued CT surveillance
depending on patient's clinical situation. No other pulmonary
lesions and no enlarged or hypermetabolic mediastinal or hilar lymph
nodes.

Incidental CT findings: none

ABDOMEN/PELVIS: No abnormal hypermetabolic activity within the
liver, pancreas, adrenal glands, or spleen. No hypermetabolic lymph
nodes in the abdomen or pelvis.

Incidental CT findings: Advanced atherosclerotic calcifications
involving the aorta and iliac arteries.

SKELETON: No focal hypermetabolic activity to suggest skeletal
metastasis.

Incidental CT findings: none
IMPRESSION: 1. 16 x 12 mm ground-glass nodule in the right middle lobe does not
demonstrate any appreciable FDG uptake. It still could be a slow
growing adenocarcinoma. Biopsy versus continued CT surveillance.
2. No enlarged or hypermetabolic mediastinal or hilar lymph nodes
and no evidence of metastatic disease involving the neck, abdomen or
pelvis.

## 2019-06-02 ENCOUNTER — Other Ambulatory Visit: Payer: Self-pay | Admitting: Internal Medicine

## 2019-06-02 DIAGNOSIS — R002 Palpitations: Secondary | ICD-10-CM

## 2019-06-07 ENCOUNTER — Telehealth: Payer: Self-pay

## 2019-06-07 ENCOUNTER — Telehealth: Payer: Self-pay | Admitting: Radiology

## 2019-06-07 NOTE — Telephone Encounter (Signed)
Enrolled patient for a 30 Day [employer] Event monitor to be mailed. Info was verified and patient knows monitor should arrive in 3-4 days

## 2019-06-07 NOTE — Telephone Encounter (Signed)
Copied from [place] [id number]. Topic: General - Other >> Jun 07, 2019 11:46 AM Jodie Echevaria wrote: Reason for CRM: Patient called to say that she received a call about a halter monitor but she did not remember discussing this with Dr Jenny Reichmann. She is asking for a call back with some explanation as to why she need this monitor. Ph# [phone]

## 2019-06-07 NOTE — Telephone Encounter (Signed)
This was discussed with pt at her last visit 04/20/2019  The heart monitor was to try to evaluate for any heart rhythm problem that may cause a stroke  OK to continue with doing the heart monitor

## 2019-06-08 NOTE — Telephone Encounter (Signed)
Pt has been informed and will get monitor scheduled.

## 2019-06-13 ENCOUNTER — Other Ambulatory Visit: Payer: Self-pay | Admitting: Internal Medicine

## 2019-06-16 ENCOUNTER — Telehealth: Payer: Self-pay | Admitting: Internal Medicine

## 2019-06-16 MED ORDER — BUDESONIDE 32 MCG/ACT NA SUSP: 1.0000 | Freq: Every day | NASAL | 1 refills | Status: AC

## 2019-06-16 NOTE — Telephone Encounter (Signed)
Medication Refill - Medication: budesonide (RHINOCORT AQUA) 32 MCG/ACT nasal spray  Patient called to request a refill for the above medication  Preferred Pharmacy:   [facility] [address] [place] - [place] AT [place] [phone] (Phone) [phone] (Fax)

## 2019-06-30 ENCOUNTER — Other Ambulatory Visit: Payer: Medicare Other

## 2019-06-30 ENCOUNTER — Ambulatory Visit: Payer: Medicare Other | Admitting: Cardiothoracic Surgery

## 2019-07-05 ENCOUNTER — Telehealth: Payer: Self-pay | Admitting: Internal Medicine

## 2019-07-05 MED ORDER — ATORVASTATIN CALCIUM 40 MG PO TABS: 40.0000 mg | ORAL_TABLET | Freq: Every day | ORAL | 0 refills | Status: DC

## 2019-07-05 NOTE — Telephone Encounter (Signed)
Copied from [place] [id number]. Topic: Quick Communication - Rx Refill/Question >> Jul 05, 2019 11:36 AM Percell Belt A wrote: Medication: atorvastatin (LIPITOR) 40 MG tablet [[id number]]  Has the patient contacted their pharmacy? yes (Agent: If no, request that the patient contact the pharmacy for the refill.) (Agent: If yes, when and what did the pharmacy advise?)  Preferred Pharmacy (with phone number or street name): [id number] - [place], [address] AT [place]: Please be advised that RX refills may take up to 3 business days. We ask that you follow-up with your pharmacy.

## 2019-07-14 ENCOUNTER — Encounter (INDEPENDENT_AMBULATORY_CARE_PROVIDER_SITE_OTHER): Payer: Medicare Other

## 2019-07-14 DIAGNOSIS — R002 Palpitations: Secondary | ICD-10-CM | POA: Diagnosis not present

## 2019-07-28 ENCOUNTER — Ambulatory Visit (INDEPENDENT_AMBULATORY_CARE_PROVIDER_SITE_OTHER): Payer: Medicare Other | Admitting: Cardiothoracic Surgery

## 2019-07-28 ENCOUNTER — Ambulatory Visit
Admission: RE | Admit: 2019-07-28 | Discharge: 2019-07-28 | Disposition: A | Discharge status: Home / Self Care | Payer: Medicare Other | Source: Ambulatory Visit | Attending: Cardiothoracic Surgery | Admitting: Cardiothoracic Surgery

## 2019-07-28 ENCOUNTER — Other Ambulatory Visit: Payer: Medicare Other

## 2019-07-28 ENCOUNTER — Encounter: Payer: Self-pay | Admitting: Cardiothoracic Surgery

## 2019-07-28 ENCOUNTER — Other Ambulatory Visit: Payer: Self-pay

## 2019-07-28 VITALS — BP 131/78 | HR 52 | Temp 97.3°F | Resp 16 | Ht 60.0 in | Wt 128.0 lb

## 2019-07-28 DIAGNOSIS — C342 Malignant neoplasm of middle lobe, bronchus or lung: Secondary | ICD-10-CM | POA: Diagnosis not present

## 2019-07-28 DIAGNOSIS — Z902 Acquired absence of lung [part of]: Secondary | ICD-10-CM

## 2019-07-28 DIAGNOSIS — C3490 Malignant neoplasm of unspecified part of unspecified bronchus or lung: Secondary | ICD-10-CM

## 2019-07-28 NOTE — Progress Notes (Signed)
[address]       [place] [address]             [phone]                  [place] [facility] [place] Medical Record #[id number] Date of Birth: 04-03-1933  Referring HE:RDEY, [name], MD Primary Cardiology: Primary Care:John, Hunt Oris, MD  Chief Complaint:  Follow Up Visit 05/03/2018 PREOPERATIVE DIAGNOSIS:  Right middle lobe adenocarcinoma of the lung. POSTOPERATIVE DIAGNOSIS:  Right middle lobe adenocarcinoma of the lung. SURGICAL PROCEDURE:  Bronchoscopy, right video-assisted thoracoscopy, right middle lobectomy, lymph node dissection, and placement of On-Q. SURGEON:  Lanelle Bal, MD  Cancer Staging Adenocarcinoma, lung, right [facility]) Staging form: Lung, AJCC 8th Edition - Pathologic stage from 05/07/2018: Stage IA1 (pT1a, pN0, cM0) - Signed by Grace Isaac, MD on 05/07/2018   History of Present Illness:     Patient returns to the office today in follow-up after right middle lobectomy in May 2019 .  She returns today for follow-up CT scan.  She now has a cardiac monitor on placed by primary care.   She has had no respiratory difficulty since last seen, denies hemoptysis     Zubrod Score: At the time of surgery this patients most appropriate activity status/level should be described as: [x]     0    Normal activity, no symptoms []     1    Restricted in physical strenuous activity but ambulatory, able to do out light work []     2    Ambulatory and capable of self care, unable to do work activities, up and about                 >50 % of waking hours                                                                                   []     3    Only limited self care, in bed greater than 50% of waking hours []     4    Completely disabled, no self care, confined to bed or chair []     5    Moribund  Social History   Tobacco Use  Smoking Status Former Smoker   Years: 3.00   Quit date: 12/29/1961   Years since quitting: 57.6  Smokeless  Tobacco Never Used  Tobacco Comment   smoked as teen--1-2 cigs per day       Allergies  Allergen Reactions   Clarithromycin Nausea Only    Current Outpatient Medications  Medication Sig Dispense Refill   aspirin (ASPIRIN LOW DOSE) 81 MG tablet Take 81 mg by mouth daily at 2 PM.      atorvastatin (LIPITOR) 40 MG tablet Take 1 tablet (40 mg total) by mouth daily at 6 PM. Annual appt due in May must see provider for future refills 90 tablet 0   budesonide (RHINOCORT AQUA) 32 MCG/ACT nasal spray Place 1 spray into both nostrils daily. 25.8 g 1   Cholecalciferol (VITAMIN D3) 2000 UNITS capsule Take 2,000 Units by mouth daily at 2 PM.  levothyroxine (SYNTHROID, LEVOTHROID) 75 MCG tablet TAKE 1 TABLET DAILY (Patient taking differently: TAKE 1 TABLET DAILY MIDDAY) 90 tablet 3   lisinopril (ZESTRIL) 40 MG tablet TAKE 1 TABLET (40 MG TOTAL) DAILY 90 tablet 3   meclizine (ANTIVERT) 25 MG tablet TAKE 1/2 TO 1 TABLET BY MOUTH EVERY 6 HOURS AS NEEDED 60 tablet 5   metoprolol succinate (TOPROL XL) 25 MG 24 hr tablet Take 1 tablet (25 mg total) by mouth daily. 90 tablet 3   montelukast (SINGULAIR) 10 MG tablet TAKE 1 TABLET DAILY 90 tablet 3   Multiple Vitamins-Minerals (WOMENS 50+ MULTI VITAMIN/MIN PO) Take 1 tablet by mouth daily at 2 PM.      PREMARIN 0.3 MG tablet TAKE 1 TABLET DAILY 90 tablet 3   vitamin B-12 (CYANOCOBALAMIN) 1000 MCG tablet Take 1,000 mcg by mouth daily at 2 PM.      No current facility-administered medications for this visit.        Physical Exam: BP 131/78 (BP Location: Right Arm, Patient Position: Sitting, Cuff Size: Normal)    Pulse (!) 52    Temp (!) 97.3 F (36.3 C) Comment: thermal   Resp 16    Ht 5' (1.524 m)    Wt 128 lb (58.1 kg)    SpO2 93% Comment: RA   BMI 25.00 kg/m  Diagnostic Studies & Laboratory data:     General appearance: alert, cooperative and appears stated age Neck: no adenopathy, no carotid bruit, no JVD, supple, symmetrical,  trachea midline and thyroid not enlarged, symmetric, no tenderness/mass/nodules Lymph nodes: Cervical, supraclavicular, and axillary nodes normal. Resp: clear to auscultation bilaterally Cardio: regular rate and rhythm, S1, S2 normal, no murmur, click, rub or gallop GI: soft, non-tender; bowel sounds normal; no masses,  no organomegaly Extremities: edema Mild bilateral lower extremity edema Neurologic: Grossly normal Cardiac monitor strapped to her chest    recent Radiology Findings: Ct Chest Wo Contrast  Result Date: 07/28/2019 CLINICAL DATA:  Restaging non-small cell lung cancer. Status post right middle lobe lobectomy 05/03/2018 EXAM: CT CHEST WITHOUT CONTRAST TECHNIQUE: Multidetector CT imaging of the chest was performed following the standard protocol without IV contrast. COMPARISON:  CT scan 12/16/2018 and 03/04/2018 FINDINGS: Cardiovascular: The heart is normal in size and stable. No pericardial effusion. Stable tortuosity, ectasia and calcification of the thoracic aorta. Stable fairly extensive three-vessel coronary artery calcifications. Mediastinum/Nodes: No mediastinal or hilar mass or lymphadenopathy. The esophagus is grossly normal. Lungs/Pleura: Stable surgical changes from a right middle lobe lobectomy. No findings suspicious for residual or recurrent tumor. Stable mild emphysematous changes. No infiltrates, edema or effusions. No worrisome pulmonary nodules to suggest pulmonary metastatic disease. Stable basilar scarring changes. No pleural effusions. Upper Abdomen: No significant upper abdominal findings. Stable atherosclerotic calcifications involving the aorta and branch vessels. No obvious hepatic or adrenal gland lesions. Musculoskeletal: No significant bony findings. Stable sclerotic lesion in a lower thoracic vertebral body, likely benign bone. IMPRESSION: 1. Stable surgical changes from a right middle lobe lobectomy. No findings suspicious for residual or recurrent tumor. 2. No  new pulmonary lesions or pulmonary nodules. 3. No mediastinal or hilar mass or adenopathy. 4. Stable aortic and branch vessel calcifications. Aortic Atherosclerosis (ICD10-I70.0) and Emphysema (ICD10-J43.9). Electronically Signed   By: Marijo Sanes M.D.   On: 07/28/2019 10:06     I have independently reviewed the above radiology findings and reviewed findings  with the patient.  Recent Labs: Lab Results  Component Value Date   WBC 6.4 05/08/2018  HGB 11.2 (L) 05/08/2018   HCT 35.0 (L) 05/08/2018   PLT 232 05/08/2018   GLUCOSE 100 (H) 05/08/2018   CHOL 177 02/13/2015   TRIG 113.0 02/13/2015   HDL 73.70 02/13/2015   LDLDIRECT 123.2 12/04/2008   LDLCALC 81 02/13/2015   ALT 16 05/05/2018   AST 34 05/05/2018   NA 141 05/08/2018   K 3.8 05/08/2018   CL 103 05/08/2018   CREATININE 0.93 05/08/2018   BUN 9 05/08/2018   CO2 31 05/08/2018   TSH 1.73 02/13/2015   INR 0.96 04/29/2018      Assessment / Plan:   Stable following right middle lobectomy for stage I a non-small cell carcinoma now 14 months postop-follow-up CT scan shows no evidence of recurrence or new pulmonary nodules, plan follow-up CT scan in 6 months     Grace Isaac 07/28/2019 12:31 PM

## 2019-08-22 ENCOUNTER — Other Ambulatory Visit: Payer: Self-pay | Admitting: Internal Medicine

## 2019-08-26 ENCOUNTER — Encounter: Payer: Self-pay | Admitting: Internal Medicine

## 2019-10-07 ENCOUNTER — Ambulatory Visit (INDEPENDENT_AMBULATORY_CARE_PROVIDER_SITE_OTHER): Payer: Medicare Other

## 2019-10-07 ENCOUNTER — Other Ambulatory Visit: Payer: Self-pay

## 2019-10-07 DIAGNOSIS — Z23 Encounter for immunization: Secondary | ICD-10-CM | POA: Diagnosis not present

## 2019-10-24 ENCOUNTER — Other Ambulatory Visit: Payer: Self-pay

## 2019-10-24 ENCOUNTER — Telehealth: Payer: Medicare Other | Admitting: Family Medicine

## 2019-10-25 ENCOUNTER — Ambulatory Visit (HOSPITAL_COMMUNITY)
Admission: EM | Admit: 2019-10-25 | Discharge: 2019-10-25 | Disposition: A | Discharge status: Home / Self Care | Payer: Medicare Other | Attending: Family Medicine | Admitting: Family Medicine

## 2019-10-25 ENCOUNTER — Other Ambulatory Visit: Payer: Self-pay

## 2019-10-25 ENCOUNTER — Encounter (HOSPITAL_COMMUNITY): Payer: Self-pay

## 2019-10-25 DIAGNOSIS — R319 Hematuria, unspecified: Secondary | ICD-10-CM | POA: Insufficient documentation

## 2019-10-25 DIAGNOSIS — N390 Urinary tract infection, site not specified: Secondary | ICD-10-CM | POA: Diagnosis not present

## 2019-10-25 LAB — POCT URINALYSIS DIP (DEVICE)
Bilirubin Urine: NEGATIVE
Glucose, UA: NEGATIVE mg/dL
Ketones, ur: NEGATIVE mg/dL
Nitrite: NEGATIVE
Protein, ur: 30 mg/dL — AB
Specific Gravity, Urine: 1.025 (ref 1.005–1.030)
Urobilinogen, UA: 0.2 mg/dL (ref 0.0–1.0)
pH: 6 (ref 5.0–8.0)

## 2019-10-25 MED ORDER — CEPHALEXIN 500 MG PO CAPS: 500.0000 mg | ORAL_CAPSULE | Freq: Three times a day (TID) | ORAL | 0 refills | Status: DC
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
## 2019-10-25 NOTE — Discharge Instructions (Addendum)
Take the antibiotic as prescribed.    Follow-up with your primary care provider if your symptoms are not improving.

## 2019-10-25 NOTE — ED Triage Notes (Signed)
Pt states she has a UTI x 1 week. Pt states she has pressure and burning when she voids.

## 2019-10-26 LAB — URINE CULTURE: Culture: <10000 — AB

## 2019-10-31 ENCOUNTER — Encounter (HOSPITAL_COMMUNITY): Payer: Self-pay | Admitting: Family Medicine

## 2019-10-31 ENCOUNTER — Other Ambulatory Visit: Payer: Self-pay

## 2019-10-31 ENCOUNTER — Ambulatory Visit (HOSPITAL_COMMUNITY)
Admission: EM | Admit: 2019-10-31 | Discharge: 2019-10-31 | Disposition: A | Discharge status: Home / Self Care | Payer: Medicare Other | Attending: Family Medicine | Admitting: Family Medicine

## 2019-10-31 DIAGNOSIS — R8281 Pyuria: Secondary | ICD-10-CM | POA: Diagnosis not present

## 2019-10-31 LAB — POCT URINALYSIS DIP (DEVICE)
Bilirubin Urine: NEGATIVE
Glucose, UA: NEGATIVE mg/dL
Ketones, ur: NEGATIVE mg/dL
Nitrite: NEGATIVE
Protein, ur: 100 mg/dL — AB
Specific Gravity, Urine: 1.025 (ref 1.005–1.030)
Urobilinogen, UA: 0.2 mg/dL (ref 0.0–1.0)
pH: 6 (ref 5.0–8.0)

## 2019-10-31 MED ORDER — DOXYCYCLINE HYCLATE 100 MG PO TABS: 100.0000 mg | ORAL_TABLET | Freq: Two times a day (BID) | ORAL | 0 refills | Status: DC

## 2019-10-31 NOTE — Discharge Instructions (Signed)
Please return if the symptoms are not resolving by Wednesday

## 2019-10-31 NOTE — ED Triage Notes (Signed)
Patient presents to Urgent Care with complaints of vaginal discharge since being treated for a UTI last week. Patient reports the discharge does not itch.
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
## 2019-11-01 LAB — CERVICOVAGINAL ANCILLARY ONLY
Bacterial vaginitis: NEGATIVE
Candida vaginitis: POSITIVE — AB

## 2019-11-01 LAB — URINE CULTURE: Culture: NO GROWTH

## 2019-11-03 ENCOUNTER — Telehealth: Payer: Self-pay | Admitting: Emergency Medicine

## 2019-11-03 MED ORDER — FLUCONAZOLE 150 MG PO TABS: 150.0000 mg | ORAL_TABLET | Freq: Once | ORAL | 0 refills | Status: AC

## 2019-11-03 NOTE — Telephone Encounter (Signed)
Pt called stating there were a few "red dots" on her arm. Per Loura Halt, this could be a reaction. Pt may stop the doxy, urine culture was negative. Pt agreeable to plan, all questions answered.

## 2019-11-03 NOTE — Telephone Encounter (Signed)
Test for candida (yeast) was positive.  Prescription for fluconazole 150mg  po now, repeat dose in 3d if needed, #2 no refills, sent to the pharmacy of record.  Recheck or followup with PCP for further evaluation if symptoms are not improving.    Patient contacted and made aware of    results, all questions answered

## 2019-11-04 ENCOUNTER — Encounter: Payer: Self-pay | Admitting: Internal Medicine

## 2019-11-04 ENCOUNTER — Other Ambulatory Visit: Payer: Self-pay

## 2019-11-04 ENCOUNTER — Ambulatory Visit (INDEPENDENT_AMBULATORY_CARE_PROVIDER_SITE_OTHER): Payer: Medicare Other | Admitting: Internal Medicine

## 2019-11-04 DIAGNOSIS — I10 Essential (primary) hypertension: Secondary | ICD-10-CM | POA: Diagnosis not present

## 2019-11-04 DIAGNOSIS — B029 Zoster without complications: Secondary | ICD-10-CM | POA: Insufficient documentation

## 2019-11-04 DIAGNOSIS — F411 Generalized anxiety disorder: Secondary | ICD-10-CM

## 2019-11-04 MED ORDER — VALACYCLOVIR HCL 1 G PO TABS: 1000.0000 mg | ORAL_TABLET | Freq: Three times a day (TID) | ORAL | 0 refills | Status: AC

## 2019-11-04 MED ORDER — LIDOCAINE 5 % EX PTCH: 1.0000 | MEDICATED_PATCH | CUTANEOUS | 0 refills | Status: DC

## 2019-11-04 MED ORDER — GABAPENTIN 100 MG PO CAPS: 100.0000 mg | ORAL_CAPSULE | Freq: Three times a day (TID) | ORAL | 3 refills | Status: DC

## 2019-11-04 MED ORDER — HYDROCODONE-ACETAMINOPHEN 5-325 MG PO TABS: 1.0000 | ORAL_TABLET | Freq: Four times a day (QID) | ORAL | 0 refills | Status: DC | PRN
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
## 2019-11-04 NOTE — Patient Instructions (Addendum)
Please take all new medication as prescribed - the anti-viral medication for shingles called Valtrex, the lidoderm patch for pain as needed, the gabapentin fo rpain,and the hydrocodone as needed for pain as well as needed  Please continue all other medications as before, including the yeast infection medication  Please have the pharmacy call with any other refills you may need.  Please continue your efforts at being more active, low cholesterol diet, and weight control.  Please keep your appointments with your specialists as you may have planned  Please return in 3 months, or sooner if needed

## 2019-11-05 ENCOUNTER — Encounter: Payer: Self-pay | Admitting: Internal Medicine

## 2019-11-05 NOTE — Assessment & Plan Note (Signed)
stable overall by history and exam, recent data reviewed with pt, and pt to continue medical treatment as before,  to f/u any worsening symptoms or concerns  

## 2019-11-05 NOTE — Assessment & Plan Note (Signed)
Mod to severe pain, for valtrex asd, lidoderm asd, vicodin prn, d/w p natural hx, to f/u any worsening symptoms or concerns

## 2019-11-18 ENCOUNTER — Encounter (HOSPITAL_COMMUNITY): Payer: Self-pay | Admitting: Emergency Medicine

## 2019-11-18 ENCOUNTER — Emergency Department (HOSPITAL_COMMUNITY): Payer: Medicare Other

## 2019-11-18 ENCOUNTER — Emergency Department (HOSPITAL_COMMUNITY)
Admission: EM | Admit: 2019-11-18 | Discharge: 2019-11-18 | Disposition: A | Discharge status: Home / Self Care | Payer: Medicare Other | Attending: Emergency Medicine | Admitting: Emergency Medicine

## 2019-11-18 ENCOUNTER — Other Ambulatory Visit: Payer: Self-pay

## 2019-11-18 DIAGNOSIS — R519 Headache, unspecified: Secondary | ICD-10-CM | POA: Insufficient documentation

## 2019-11-18 DIAGNOSIS — Z79899 Other long term (current) drug therapy: Secondary | ICD-10-CM | POA: Diagnosis not present

## 2019-11-18 DIAGNOSIS — S0081XA Abrasion of other part of head, initial encounter: Secondary | ICD-10-CM | POA: Diagnosis not present

## 2019-11-18 DIAGNOSIS — Y999 Unspecified external cause status: Secondary | ICD-10-CM | POA: Diagnosis not present

## 2019-11-18 DIAGNOSIS — Z7982 Long term (current) use of aspirin: Secondary | ICD-10-CM | POA: Diagnosis not present

## 2019-11-18 DIAGNOSIS — Y929 Unspecified place or not applicable: Secondary | ICD-10-CM | POA: Diagnosis not present

## 2019-11-18 DIAGNOSIS — R001 Bradycardia, unspecified: Secondary | ICD-10-CM | POA: Diagnosis not present

## 2019-11-18 DIAGNOSIS — S199XXA Unspecified injury of neck, initial encounter: Secondary | ICD-10-CM | POA: Diagnosis not present

## 2019-11-18 DIAGNOSIS — Y939 Activity, unspecified: Secondary | ICD-10-CM | POA: Insufficient documentation

## 2019-11-18 DIAGNOSIS — M542 Cervicalgia: Secondary | ICD-10-CM | POA: Diagnosis not present

## 2019-11-18 DIAGNOSIS — S0003XA Contusion of scalp, initial encounter: Secondary | ICD-10-CM | POA: Diagnosis not present

## 2019-11-18 DIAGNOSIS — Z87891 Personal history of nicotine dependence: Secondary | ICD-10-CM | POA: Insufficient documentation

## 2019-11-18 DIAGNOSIS — I10 Essential (primary) hypertension: Secondary | ICD-10-CM | POA: Diagnosis not present

## 2019-11-18 DIAGNOSIS — S0031XA Abrasion of nose, initial encounter: Secondary | ICD-10-CM | POA: Diagnosis not present

## 2019-11-18 DIAGNOSIS — W19XXXA Unspecified fall, initial encounter: Secondary | ICD-10-CM

## 2019-11-18 DIAGNOSIS — I499 Cardiac arrhythmia, unspecified: Secondary | ICD-10-CM | POA: Diagnosis not present

## 2019-11-18 DIAGNOSIS — E039 Hypothyroidism, unspecified: Secondary | ICD-10-CM | POA: Diagnosis not present

## 2019-11-18 DIAGNOSIS — W07XXXA Fall from chair, initial encounter: Secondary | ICD-10-CM | POA: Diagnosis not present

## 2019-11-18 DIAGNOSIS — I213 ST elevation (STEMI) myocardial infarction of unspecified site: Secondary | ICD-10-CM | POA: Diagnosis not present

## 2019-11-18 NOTE — ED Triage Notes (Signed)
Pt via [facility] from home c/o fall without LOC. Pt states she was on couch when she began to "doze off" when she fell and hit her head. Pt presents with abrasions to forehead and bridge of the nose. Pt. Takes aspirin. Per EMS pt. Ambulatory at scene able to open door for EMS. A&Ox4  EMS VS BP 178/92 HR 52 RR 12 SpO2 96 RA CBG 140  T 98.4
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
## 2019-11-18 NOTE — ED Notes (Signed)
Discharge instructions discussed with pt. Pt verbalized understanding. Pt. To follow up with PCP r/t high BP and low HR.

## 2019-11-18 NOTE — Discharge Instructions (Signed)
Please contact your doctor and make an ER follow-up appointment with him.    Please discuss your medications, specifically your blood pressure medication with your doctor.  In the ER your heart rate was a little low and your blood pressure was a little high.  This may indicate that your blood pressure medications need to be changed.  Please return to the ER for new or worsening symptoms.

## 2019-11-18 NOTE — ED Notes (Signed)
Patient transported to CT 

## 2019-11-18 NOTE — ED Notes (Signed)
Pt able to ambulate independently to bathroom. Pt denies dizziness. Pt. Gait stable

## 2019-11-21 ENCOUNTER — Telehealth: Payer: Self-pay | Admitting: Internal Medicine

## 2019-11-21 NOTE — Telephone Encounter (Signed)
Pt called stating she has been using her lidocaine patches for the shingles. Pt states however that the patches have been pulling of her skin as she pulls them off. Pt is requesting to have something else sent in for her. Please advise.    [id number] - [place], [address]  [address] [place] [address]  Phone: [phone] Fax: [phone]  Not a 24 hour pharmacy; exact hours not known.

## 2019-11-21 NOTE — Telephone Encounter (Signed)
Please see patient's request below.

## 2019-11-22 NOTE — Telephone Encounter (Signed)
Sorry, I have not other patches or other tx for pain, but she may consider trying the otc pain patches

## 2019-11-22 NOTE — Telephone Encounter (Signed)
Pt has been informed.

## 2019-12-15 ENCOUNTER — Other Ambulatory Visit: Payer: Self-pay | Admitting: Cardiothoracic Surgery

## 2019-12-15 DIAGNOSIS — C3491 Malignant neoplasm of unspecified part of right bronchus or lung: Secondary | ICD-10-CM

## 2020-01-04 ENCOUNTER — Other Ambulatory Visit: Payer: Self-pay

## 2020-01-04 ENCOUNTER — Encounter (HOSPITAL_COMMUNITY): Payer: Self-pay | Admitting: Family Medicine

## 2020-01-04 ENCOUNTER — Ambulatory Visit (HOSPITAL_COMMUNITY)
Admission: EM | Admit: 2020-01-04 | Discharge: 2020-01-04 | Disposition: A | Discharge status: Home / Self Care | Payer: Medicare Other | Attending: Family Medicine | Admitting: Family Medicine

## 2020-01-04 DIAGNOSIS — N898 Other specified noninflammatory disorders of vagina: Secondary | ICD-10-CM | POA: Insufficient documentation

## 2020-01-04 NOTE — ED Triage Notes (Signed)
Pt here for recurrent vaginal discharge.  Pt states the last time she was here she took one pill of the two she was prescribed and never took the other one.

## 2020-01-04 NOTE — Discharge Instructions (Addendum)
Take the fluconazole pill that you already have I am sending your swab for testing and will call with any positive results and treat if needed.
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
## 2020-01-06 ENCOUNTER — Telehealth: Payer: Self-pay | Admitting: Emergency Medicine

## 2020-01-06 LAB — CERVICOVAGINAL ANCILLARY ONLY
Bacterial vaginitis: NEGATIVE
Candida vaginitis: NEGATIVE
Chlamydia: NEGATIVE
Neisseria Gonorrhea: NEGATIVE
Trichomonas: POSITIVE — AB

## 2020-01-06 MED ORDER — METRONIDAZOLE 500 MG PO TABS: 2000.0000 mg | ORAL_TABLET | Freq: Once | ORAL | 0 refills | Status: AC

## 2020-01-06 NOTE — Telephone Encounter (Signed)
Trichomonas is positive. Rx  for Flagyl 2 grams, once was sent to the pharmacy of record. Pt needs education to refrain from sexual intercourse for 7 days to give the medicine time to work. Sexual partners need to be notified and tested/treated. Condoms may reduce risk of reinfection. Recheck for further evaluation if symptoms are not improving.   Attempted to reach patient. No answer at this time. Voicemail left.

## 2020-01-07 ENCOUNTER — Telehealth (HOSPITAL_COMMUNITY): Payer: Self-pay | Admitting: Emergency Medicine

## 2020-01-07 NOTE — Telephone Encounter (Signed)
Patient contacted by phone and made aware of trich   results. Pt verbalized understanding and had all questions answered.

## 2020-01-19 ENCOUNTER — Ambulatory Visit
Admission: RE | Admit: 2020-01-19 | Discharge: 2020-01-19 | Disposition: A | Discharge status: Home / Self Care | Payer: Medicare Other | Source: Ambulatory Visit | Attending: Cardiothoracic Surgery | Admitting: Cardiothoracic Surgery

## 2020-01-19 ENCOUNTER — Ambulatory Visit: Payer: Medicare Other | Admitting: Cardiothoracic Surgery

## 2020-01-19 ENCOUNTER — Inpatient Hospital Stay: Admission: RE | Admit: 2020-01-19 | Payer: Medicare Other | Source: Ambulatory Visit

## 2020-01-19 DIAGNOSIS — C3491 Malignant neoplasm of unspecified part of right bronchus or lung: Secondary | ICD-10-CM

## 2020-01-19 DIAGNOSIS — C3490 Malignant neoplasm of unspecified part of unspecified bronchus or lung: Secondary | ICD-10-CM | POA: Diagnosis not present

## 2020-01-20 ENCOUNTER — Other Ambulatory Visit: Payer: Self-pay

## 2020-01-20 ENCOUNTER — Ambulatory Visit (INDEPENDENT_AMBULATORY_CARE_PROVIDER_SITE_OTHER): Payer: Medicare Other | Admitting: Cardiothoracic Surgery

## 2020-01-20 ENCOUNTER — Other Ambulatory Visit: Payer: Self-pay | Admitting: Internal Medicine

## 2020-01-20 ENCOUNTER — Ambulatory Visit: Payer: Medicare Other | Admitting: Cardiothoracic Surgery

## 2020-01-20 ENCOUNTER — Encounter: Payer: Self-pay | Admitting: Cardiothoracic Surgery

## 2020-01-20 VITALS — BP 170/85 | HR 59 | Temp 97.7°F | Resp 18 | Ht 60.0 in | Wt 128.9 lb

## 2020-01-20 DIAGNOSIS — C801 Malignant (primary) neoplasm, unspecified: Secondary | ICD-10-CM | POA: Diagnosis not present

## 2020-01-20 DIAGNOSIS — Z902 Acquired absence of lung [part of]: Secondary | ICD-10-CM

## 2020-01-20 NOTE — Patient Instructions (Signed)
COVID-19 Vaccine Information can be found at: [email or web address] For questions related to vaccine distribution or appointments, please email [email or web address] or call [phone].    

## 2020-01-20 NOTE — Progress Notes (Signed)
[address]       [place] [address]             [phone]                  [name] Medical Record #[id number] Date of Birth: 1933-11-22  Referring GX:[id number], [name], MD Primary Cardiology: Primary Care:John, Hunt Oris, MD  Chief Complaint:  Follow Up Visit 05/03/2018 PREOPERATIVE DIAGNOSIS:  Right middle lobe adenocarcinoma of the lung. POSTOPERATIVE DIAGNOSIS:  Right middle lobe adenocarcinoma of the lung. SURGICAL PROCEDURE:  Bronchoscopy, right video-assisted thoracoscopy, right middle lobectomy, lymph node dissection, and placement of On-Q. SURGEON:  Lanelle Bal, MD  Cancer Staging Adenocarcinoma, lung, right [facility]) Staging form: Lung, AJCC 8th Edition - Pathologic stage from 05/07/2018: Stage IA1 (pT1a, pN0, cM0) - Signed by Grace Isaac, MD on 05/07/2018   History of Present Illness:     Patient returns to the office today in follow-up after right middle lobectomy in May 2019 .  She returns today for follow-up CT scan.  She now has a cardiac monitor on placed by primary care.   She has had no respiratory difficulty since last seen, denies hemoptysis     Zubrod Score: At the time of surgery this patient's most appropriate activity status/level should be described as: [x]     0    Normal activity, no symptoms []     1    Restricted in physical strenuous activity but ambulatory, able to do out light work []     2    Ambulatory and capable of self care, unable to do work activities, up and about                 >50 % of waking hours                                                                                   []     3    Only limited self care, in bed greater than 50% of waking hours []     4    Completely disabled, no self care, confined to bed or chair []     5    Moribund  Social History   Tobacco Use  Smoking Status Former Smoker  . Years: 3.00  . Quit date: 12/29/1961  . Years since quitting: 58.0  Smokeless  Tobacco Never Used  Tobacco Comment   smoked as teen--1-2 cigs per day       Allergies  Allergen Reactions  . Doxycycline Other (See Comments)    Patient was placed on doxy and on 11/05 called and stated there were "small red dots" on her arm.   . Clarithromycin Nausea Only    Current Outpatient Medications  Medication Sig Dispense Refill  . aspirin (ASPIRIN LOW DOSE) 81 MG tablet Take 81 mg by mouth daily at 2 PM.     . atorvastatin (LIPITOR) 40 MG tablet Take 1 tablet (40 mg total) by mouth daily at 6 PM. Overdue for Annual appt w/labs must see provider for future refills 30 tablet 0  . Cholecalciferol (VITAMIN D3) 2000 UNITS capsule Take  2,000 Units by mouth daily at 2 PM.     . doxycycline (VIBRA-TABS) 100 MG tablet Take 1 tablet (100 mg total) by mouth 2 (two) times daily. 14 tablet 0  . levothyroxine (SYNTHROID) 75 MCG tablet TAKE 1 TABLET DAILY 90 tablet 3  . lisinopril (ZESTRIL) 40 MG tablet TAKE 1 TABLET (40 MG TOTAL) DAILY 90 tablet 3  . meclizine (ANTIVERT) 25 MG tablet TAKE 1/2 TO 1 TABLET BY MOUTH EVERY 6 HOURS AS NEEDED 60 tablet 5  . metoprolol succinate (TOPROL XL) 25 MG 24 hr tablet Take 1 tablet (25 mg total) by mouth daily. 90 tablet 3  . montelukast (SINGULAIR) 10 MG tablet TAKE 1 TABLET DAILY 90 tablet 3  . Multiple Vitamins-Minerals (WOMENS 50+ MULTI VITAMIN/MIN PO) Take 1 tablet by mouth daily at 2 PM.     . PREMARIN 0.3 MG tablet TAKE 1 TABLET DAILY 90 tablet 3  . vitamin B-12 (CYANOCOBALAMIN) 1000 MCG tablet Take 1,000 mcg by mouth daily at 2 PM.     . budesonide (RHINOCORT AQUA) 32 MCG/ACT nasal spray Place 1 spray into both nostrils daily. (Patient not taking: Reported on 01/20/2020) 25.8 g 1  . gabapentin (NEURONTIN) 100 MG capsule Take 1 capsule (100 mg total) by mouth 3 (three) times daily. (Patient not taking: Reported on 01/20/2020) 90 capsule 3  . HYDROcodone-acetaminophen (NORCO/VICODIN) 5-325 MG tablet Take 1 tablet by mouth every 6 (six) hours as  needed. (Patient not taking: Reported on 01/20/2020) 30 tablet 0  . lidocaine (LIDODERM) 5 % Place 1 patch onto the skin daily. Remove & Discard patch within 12 hours or as directed by MD (Patient not taking: Reported on 01/20/2020) 30 patch 0   No current facility-administered medications for this visit.       Physical Exam: BP (!) 170/85   Pulse (!) 59   Temp 97.7 F (36.5 C)   Resp 18   Ht 5' (1.524 m)   Wt 58.5 kg   SpO2 97% Comment: on RA  BMI 25.17 kg/m  Diagnostic Studies & Laboratory data:     General appearance: alert, cooperative and appears stated age Neck: no adenopathy, no carotid bruit, no JVD, supple, symmetrical, trachea midline and thyroid not enlarged, symmetric, no tenderness/mass/nodules Lymph nodes: Cervical, supraclavicular, and axillary nodes normal. Resp: clear to auscultation bilaterally Cardio: regular rate and rhythm, S1, S2 normal, no murmur, click, rub or gallop GI: soft, non-tender; bowel sounds normal; no masses,  no organomegaly Extremities: edema Mild bilateral lower extremity edema Neurologic: Grossly normal Cardiac monitor strapped to her chest    recent Radiology Findings: CT CHEST WO CONTRAST  Result Date: 01/19/2020 CLINICAL DATA:  Small cell lung cancer post lumpectomy. Annual follow-up. EXAM: CT CHEST WITHOUT CONTRAST TECHNIQUE: Multidetector CT imaging of the chest was performed following the standard protocol without IV contrast. COMPARISON:  07/28/2019 FINDINGS: Cardiovascular: Heart is normal size. There is calcified plaque over the left main and 3 vessel coronary arteries. Mild calcified plaque over the thoracic aorta. Mild ectasia of the ascending thoracic aorta measuring 3.7 cm in AP diameter without significant change. Mediastinum/Nodes: No significant mediastinal or hilar adenopathy. Remaining mediastinal structures are within normal. Lungs/Pleura: Lungs are adequately inflated as there is evidence of patient's previous right  lobectomy. No evidence of pulmonary nodules or masses. Subtle 3 mm triangular opacity over the lingula abutting the major fissure slightly more focal compared to the prior exam and likely benign. No focal airspace process or effusion. Mild emphysematous disease is  present. Airways are unremarkable. Upper Abdomen: Stable cyst over the mid pole left kidney. Stable subcentimeter hypodensity over the right lobe of the liver too small to characterize but likely a cyst. Calcified plaque over the abdominal aorta. Musculoskeletal: Unchanged. IMPRESSION: 1. Postsurgical change compatible previous right middle lobectomy for lung cancer. No pulmonary masses or adenopathy. 2. 3 mm triangular nodular density over the lingula abutting the major fissure slightly more focal but likely benign. Recommend attention on follow-up. 3. Mild ectasia of the ascending thoracic aorta measuring 3.7 cm in AP diameter without significant change. Recommend annual imaging followup by CTA or MRA. This recommendation follows 2010 ACCF/AHA/AATS/ACR/ASA/SCA/SCAI/SIR/STS/SVM Guidelines for the Diagnosis and Management of Patients with Thoracic Aortic Disease. Circulation.2010; 121: K383-K184. Aortic aneurysm NOS (ICD10-I71.9). 4. Aortic Atherosclerosis (ICD10-I70.0) and Emphysema (ICD10-J43.9). Atherosclerotic coronary artery disease. 5. Stable small left renal cyst as well as stable subcentimeter right liver hypodensity too small to characterize but likely a cyst. Electronically Signed   By: Marin Olp M.D.   On: 01/19/2020 14:11     I have independently reviewed the above radiology findings and reviewed findings  with the patient.  Recent Labs: Lab Results  Component Value Date   WBC 6.4 05/08/2018   HGB 11.2 (L) 05/08/2018   HCT 35.0 (L) 05/08/2018   PLT 232 05/08/2018   GLUCOSE 100 (H) 05/08/2018   CHOL 177 02/13/2015   TRIG 113.0 02/13/2015   HDL 73.70 02/13/2015   LDLDIRECT 123.2 12/04/2008   LDLCALC 81 02/13/2015   ALT 16  05/05/2018   AST 34 05/05/2018   NA 141 05/08/2018   K 3.8 05/08/2018   CL 103 05/08/2018   CREATININE 0.93 05/08/2018   BUN 9 05/08/2018   CO2 31 05/08/2018   TSH 1.73 02/13/2015   INR 0.96 04/29/2018      Assessment / Plan:   Stable following right middle lobectomy for stage I a non-small cell carcinoma now almost 2 years postop. -No evidence of recurrence on follow-up CT scan Will obtain follow-up CT in 1 year  Recent case of shingles involving the right chest wall-with post neuropathy  Grace Isaac 01/20/2020 3:54 PM

## 2020-02-20 ENCOUNTER — Ambulatory Visit: Payer: Medicare Other

## 2020-03-01 ENCOUNTER — Other Ambulatory Visit: Payer: Self-pay | Admitting: Internal Medicine

## 2020-03-01 NOTE — Telephone Encounter (Signed)
Please refill as per office routine med refill policy (all routine meds refilled for 3 mo or monthly per pt preference up to one year from last visit, then month to month grace period for 3 mo, then further med refills will have to be denied)  

## 2020-03-19 ENCOUNTER — Ambulatory Visit: Payer: Medicare Other

## 2020-04-03 ENCOUNTER — Other Ambulatory Visit: Payer: Self-pay

## 2020-04-03 ENCOUNTER — Ambulatory Visit (INDEPENDENT_AMBULATORY_CARE_PROVIDER_SITE_OTHER): Payer: Medicare Other

## 2020-04-03 VITALS — BP 150/90 | HR 59 | Temp 98.3°F | Resp 16 | Ht 60.0 in | Wt 131.8 lb

## 2020-04-03 DIAGNOSIS — Z0000 Encounter for general adult medical examination without abnormal findings: Secondary | ICD-10-CM
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
## 2020-04-03 NOTE — Patient Instructions (Addendum)
Ms. [name] , Thank you for taking time to come for your Medicare Wellness Visit. I appreciate your ongoing commitment to your health goals. Please review the following plan we discussed and let me know if I can assist you in the future.   Screening recommendations/referrals: Colorectal Screening: last done 03/27/2015 Mammogram: last done 12/16/2016 Bone Density: last done 01/07/2018  Vision and Dental Exams: Recommended annual ophthalmology exams for early detection of glaucoma and other disorders of the eye Recommended annual dental exams for proper oral hygiene  Vaccinations: Influenza vaccine: 10/07/2019 Pneumococcal vaccine: 12/04/2008, 12/21/2013 Tdap vaccine: 12/04/2008; Declined. Please call your insurance company to determine your out of pocket expense. You may also receive this vaccine at your local pharmacy or Health Dept. Shingles vaccine: Please call your insurance company to determine your out of pocket expense for the Shingrix vaccine. You may receive this vaccine at your local pharmacy. Covid vaccine: 03/03/2020, 03/23/2020 Therapist, music)  Advanced directives: Advance directives discussed with you today. I have provided a copy for you to complete at home and have notarized. Once this is complete please bring a copy in to our office so we can scan it into your chart.  Goals: Recommend to drink at least 6-8 8oz glasses of water per day.  Recommend to exercise for at least 150 minutes per week.  Recommend to remove any items from the home that may cause slips or trips.  Recommend to decrease portion sizes by eating 3 small healthy meals and at least 2 healthy snacks per day.  Recommend to begin DASH diet as directed below  Recommend to continue efforts to reduce smoking habits until no longer smoking. Smoking Cessation literature is attached below.  Next appointment: Please schedule your Annual Wellness Visit with your Nurse Health Advisor in one year.  Preventive Care 50 Years and  Older, Female Preventive care refers to lifestyle choices and visits with your health care provider that can promote health and wellness. What does preventive care include?  A yearly physical exam. This is also called an annual well check.  Dental exams once or twice a year.  Routine eye exams. Ask your health care provider how often you should have your eyes checked.  Personal lifestyle choices, including:  Daily care of your teeth and gums.  Regular physical activity.  Eating a healthy diet.  Avoiding tobacco and drug use.  Limiting alcohol use.  Practicing safe sex.  Taking low-dose aspirin every day if recommended by your health care provider.  Taking vitamin and mineral supplements as recommended by your health care provider. What happens during an annual well check? The services and screenings done by your health care provider during your annual well check will depend on your age, overall health, lifestyle risk factors, and family history of disease. Counseling  Your health care provider may ask you questions about your:  Alcohol use.  Tobacco use.  Drug use.  Emotional well-being.  Home and relationship well-being.  Sexual activity.  Eating habits.  History of falls.  Memory and ability to understand (cognition).  Work and work Statistician.  Reproductive health. Screening  You may have the following tests or measurements:  Height, weight, and BMI.  Blood pressure.  Lipid and cholesterol levels. These may be checked every 5 years, or more frequently if you are over 52 years old.  Skin check.  Lung cancer screening. You may have this screening every year starting at age 2 if you have a 30-pack-year history of smoking and currently smoke  or have quit within the past 15 years.  Fecal occult blood test (FOBT) of the stool. You may have this test every year starting at age 71.  Flexible sigmoidoscopy or colonoscopy. You may have a sigmoidoscopy  every 5 years or a colonoscopy every 10 years starting at age 1.  Hepatitis C blood test.  Hepatitis B blood test.  Sexually transmitted disease (STD) testing.  Diabetes screening. This is done by checking your blood sugar (glucose) after you have not eaten for a while (fasting). You may have this done every 1-3 years.  Bone density scan. This is done to screen for osteoporosis. You may have this done starting at age 8.  Mammogram. This may be done every 1-2 years. Talk to your health care provider about how often you should have regular mammograms. Talk with your health care provider about your test results, treatment options, and if necessary, the need for more tests. Vaccines  Your health care provider may recommend certain vaccines, such as:  Influenza vaccine. This is recommended every year.  Tetanus, diphtheria, and acellular pertussis (Tdap, Td) vaccine. You may need a Td booster every 10 years.  Zoster vaccine. You may need this after age 77.  Pneumococcal 13-valent conjugate (PCV13) vaccine. One dose is recommended after age 42.  Pneumococcal polysaccharide (PPSV23) vaccine. One dose is recommended after age 83. Talk to your health care provider about which screenings and vaccines you need and how often you need them. This information is not intended to replace advice given to you by your health care provider. Make sure you discuss any questions you have with your health care provider. Document Released: 01/11/2016 Document Revised: 09/03/2016 Document Reviewed: 10/16/2015 Elsevier Interactive Patient Education  2017 [place] Prevention in the Home Falls can cause injuries. They can happen to people of all ages. There are many things you can do to make your home safe and to help prevent falls. What can I do on the outside of my home?  Regularly fix the edges of walkways and driveways and fix any cracks.  Remove anything that might make you trip as you walk  through a door, such as a raised step or threshold.  Trim any bushes or trees on the path to your home.  Use bright outdoor lighting.  Clear any walking paths of anything that might make someone trip, such as rocks or tools.  Regularly check to see if handrails are loose or broken. Make sure that both sides of any steps have handrails.  Any raised decks and porches should have guardrails on the edges.  Have any leaves, snow, or ice cleared regularly.  Use sand or salt on walking paths during winter.  Clean up any spills in your garage right away. This includes oil or grease spills. What can I do in the bathroom?  Use night lights.  Install grab bars by the toilet and in the tub and shower. Do not use towel bars as grab bars.  Use non-skid mats or decals in the tub or shower.  If you need to sit down in the shower, use a plastic, non-slip stool.  Keep the floor dry. Clean up any water that spills on the floor as soon as it happens.  Remove soap buildup in the tub or shower regularly.  Attach bath mats securely with double-sided non-slip rug tape.  Do not have throw rugs and other things on the floor that can make you trip. What can I do in the  bedroom?  Use night lights.  Make sure that you have a light by your bed that is easy to reach.  Do not use any sheets or blankets that are too big for your bed. They should not hang down onto the floor.  Have a firm chair that has side arms. You can use this for support while you get dressed.  Do not have throw rugs and other things on the floor that can make you trip. What can I do in the kitchen?  Clean up any spills right away.  Avoid walking on wet floors.  Keep items that you use a lot in easy-to-reach places.  If you need to reach something above you, use a strong step stool that has a grab bar.  Keep electrical cords out of the way.  Do not use floor polish or wax that makes floors slippery. If you must use wax,  use non-skid floor wax.  Do not have throw rugs and other things on the floor that can make you trip. What can I do with my stairs?  Do not leave any items on the stairs.  Make sure that there are handrails on both sides of the stairs and use them. Fix handrails that are broken or loose. Make sure that handrails are as long as the stairways.  Check any carpeting to make sure that it is firmly attached to the stairs. Fix any carpet that is loose or worn.  Avoid having throw rugs at the top or bottom of the stairs. If you do have throw rugs, attach them to the floor with carpet tape.  Make sure that you have a light switch at the top of the stairs and the bottom of the stairs. If you do not have them, ask someone to add them for you. What else can I do to help prevent falls?  Wear shoes that:  Do not have high heels.  Have rubber bottoms.  Are comfortable and fit you well.  Are closed at the toe. Do not wear sandals.  If you use a stepladder:  Make sure that it is fully opened. Do not climb a closed stepladder.  Make sure that both sides of the stepladder are locked into place.  Ask someone to hold it for you, if possible.  Clearly mark and make sure that you can see:  Any grab bars or handrails.  First and last steps.  Where the edge of each step is.  Use tools that help you move around (mobility aids) if they are needed. These include:  Canes.  Walkers.  Scooters.  Crutches.  Turn on the lights when you go into a dark area. Replace any light bulbs as soon as they burn out.  Set up your furniture so you have a clear path. Avoid moving your furniture around.  If any of your floors are uneven, fix them.  If there are any pets around you, be aware of where they are.  Review your medicines with your doctor. Some medicines can make you feel dizzy. This can increase your chance of falling. Ask your doctor what other things that you can do to help prevent  falls. This information is not intended to replace advice given to you by your health care provider. Make sure you discuss any questions you have with your health care provider. Document Released: 10/11/2009 Document Revised: 05/22/2016 Document Reviewed: 01/19/2015 Elsevier Interactive Patient Education  2017 [employer].

## 2020-04-15 ENCOUNTER — Other Ambulatory Visit: Payer: Self-pay | Admitting: Internal Medicine

## 2020-06-07 ENCOUNTER — Other Ambulatory Visit: Payer: Self-pay | Admitting: Internal Medicine

## 2020-06-07 NOTE — Telephone Encounter (Signed)
Please refill as per office routine med refill policy (all routine meds refilled for 3 mo or monthly per pt preference up to one year from last visit, then month to month grace period for 3 mo, then further med refills will have to be denied)  

## 2020-07-17 ENCOUNTER — Other Ambulatory Visit: Payer: Self-pay

## 2020-07-17 ENCOUNTER — Encounter (HOSPITAL_COMMUNITY): Payer: Self-pay

## 2020-07-17 ENCOUNTER — Ambulatory Visit (HOSPITAL_COMMUNITY)
Admission: EM | Admit: 2020-07-17 | Discharge: 2020-07-17 | Disposition: A | Discharge status: Home / Self Care | Payer: Medicare Other | Attending: Family Medicine | Admitting: Family Medicine

## 2020-07-17 DIAGNOSIS — N898 Other specified noninflammatory disorders of vagina: Secondary | ICD-10-CM | POA: Insufficient documentation

## 2020-07-17 HISTORY — DX: Zoster without complications: B02.9

## 2020-07-17 MED ORDER — METRONIDAZOLE 500 MG PO TABS: 500.0000 mg | ORAL_TABLET | Freq: Two times a day (BID) | ORAL | 0 refills | Status: DC
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
## 2020-07-17 NOTE — ED Triage Notes (Signed)
Pt c/o yellow vaginal discharge started yesterday. Pt denies any other symptoms.

## 2020-07-17 NOTE — Discharge Instructions (Signed)
Check your results on My Chart You will be called if any tests are positive I am treating you today for a bacterial infection If your test is positive for a sexually transmitted disease, you MUST notify your partner and he needs treatment also

## 2020-07-18 LAB — CERVICOVAGINAL ANCILLARY ONLY
Chlamydia: NEGATIVE
Comment: NEGATIVE
Comment: NEGATIVE
Comment: NORMAL
Neisseria Gonorrhea: NEGATIVE
Trichomonas: POSITIVE — AB

## 2020-07-19 ENCOUNTER — Telehealth (HOSPITAL_COMMUNITY): Payer: Self-pay | Admitting: Emergency Medicine

## 2020-07-19 NOTE — Telephone Encounter (Signed)
Called patient to review lab results, verified identity using two identifiers.  Provided positive trichomonas result.  Discussed treatment (finishing prescrbed flagyl from time of visit), need to refrain from sexual encounters for 7 days from time of treatment, use of condoms for protection in the future, and informing sexual partners of infection.  PAtient verbalized understanding of the above, all questions answered.

## 2020-11-26 ENCOUNTER — Telehealth: Payer: Self-pay | Admitting: Internal Medicine

## 2020-11-26 NOTE — Telephone Encounter (Signed)
Patient states that she is out of town and she needs her proof of vaccine and she doesn't have it with her. Please call her back at [phone].

## 2020-12-13 ENCOUNTER — Other Ambulatory Visit: Payer: Self-pay | Admitting: *Deleted

## 2020-12-13 DIAGNOSIS — Z85118 Personal history of other malignant neoplasm of bronchus and lung: Secondary | ICD-10-CM

## 2020-12-13 DIAGNOSIS — Z902 Acquired absence of lung [part of]: Secondary | ICD-10-CM

## 2021-01-21 NOTE — Progress Notes (Signed)
[address]       [place] [address]             [phone]    Virtual Visit via Telephone Note  I connected with [name] on 01/29/21 at 830 am  by telephone and verified that I am speaking with the correct person using two identifiers.  [facility] Record #[id number] Date of Birth: 11-29-1933  Referring JQ:BHAL, [name], MD Primary Cardiology: Primary Care:John, Hunt Oris, MD  Chief Complaint:  Follow Up Visit 05/03/2018 PREOPERATIVE DIAGNOSIS:  Right middle lobe adenocarcinoma of the lung. POSTOPERATIVE DIAGNOSIS:  Right middle lobe adenocarcinoma of the lung. SURGICAL PROCEDURE:  Bronchoscopy, right video-assisted thoracoscopy, right middle lobectomy, lymph node dissection, and placement of On-Q. SURGEON:  Lanelle Bal, MD  Cancer Staging Adenocarcinoma, lung, right [facility]) Staging form: Lung, AJCC 8th Edition - Pathologic stage from 05/07/2018: Stage IA1 (pT1a, pN0, cM0) - Signed by Grace Isaac, MD on 05/07/2018   History of Present Illness:     Patient  Returned for follow up scanafter right middle lobectomy in May 2019 .  She returns today for follow-up CT scan.    She has had no respiratory difficulty since last seen, denies hemoptysis     Zubrod Score: At the time of surgery this patient's most appropriate activity status/level should be described as: [x]     0    Normal activity, no symptoms []     1    Restricted in physical strenuous activity but ambulatory, able to do out light work []     2    Ambulatory and capable of self care, unable to do work activities, up and about                 >50 % of waking hours                                                                                   []     3    Only limited self care, in bed greater than 50% of waking hours []     4    Completely disabled, no self care, confined to bed or chair []     5    Moribund  Social History   Tobacco Use  Smoking  Status Former Smoker  . Years: 3.00  . Quit date: 12/29/1961  . Years since quitting: 59.1  Smokeless Tobacco Never Used  Tobacco Comment   smoked as teen--1-2 cigs per day       Allergies  Allergen Reactions  . Doxycycline Other (See Comments)    Patient was placed on doxy and on 11/05 called and stated there were "small red dots" on her arm.   . Clarithromycin Nausea Only    Current Outpatient Medications  Medication Sig Dispense Refill  . aspirin (ASPIRIN LOW DOSE) 81 MG tablet Take 81 mg by mouth daily at 2 PM.     . atorvastatin (LIPITOR) 40 MG tablet Take 1 tablet (40 mg total) by mouth daily at 6 PM. Overdue for Annual appt w/labs must see provider for future refills 30 tablet 0  .  budesonide (RHINOCORT AQUA) 32 MCG/ACT nasal spray Place 1 spray into both nostrils daily. (Patient not taking: Reported on 01/20/2020) 25.8 g 1  . Cholecalciferol (VITAMIN D3) 2000 UNITS capsule Take 2,000 Units by mouth daily at 2 PM.     . levothyroxine (SYNTHROID) 75 MCG tablet TAKE 1 TABLET DAILY 90 tablet 3  . lisinopril (ZESTRIL) 40 MG tablet TAKE 1 TABLET (40 MG TOTAL) DAILY 90 tablet 3  . meclizine (ANTIVERT) 25 MG tablet TAKE 1/2 TO 1 TABLET BY MOUTH EVERY 6 HOURS AS NEEDED 60 tablet 5  . metoprolol succinate (TOPROL-XL) 25 MG 24 hr tablet Take 1 tablet (25 mg total) by mouth daily. Overdue for Annual appt w/labs must see provider for future refills 30 tablet 0  . metroNIDAZOLE (FLAGYL) 500 MG tablet Take 1 tablet (500 mg total) by mouth 2 (two) times daily. 14 tablet 0  . montelukast (SINGULAIR) 10 MG tablet TAKE 1 TABLET DAILY 90 tablet 3  . Multiple Vitamins-Minerals (WOMENS 50+ MULTI VITAMIN/MIN PO) Take 1 tablet by mouth daily at 2 PM.     . PREMARIN 0.3 MG tablet TAKE 1 TABLET DAILY 90 tablet 0  . vitamin B-12 (CYANOCOBALAMIN) 1000 MCG tablet Take 1,000 mcg by mouth daily at 2 PM.      No current facility-administered medications for this visit.       Physical Exam: No exam -  patient had ct but left before being seen and called results on phone   Diagnostic Studies & Laboratory data:         Recent Radiology Findings: CT Chest Wo Contrast  Result Date: 01/24/2021 CLINICAL DATA:  Non-small cell lung cancer.  Restaging. EXAM: CT CHEST WITHOUT CONTRAST TECHNIQUE: Multidetector CT imaging of the chest was performed following the standard protocol without IV contrast. COMPARISON:  01/19/2020 FINDINGS: Cardiovascular: Heart size appears normal. No pericardial effusion. Aortic atherosclerosis. Coronary artery calcifications. Mediastinum/Nodes: No enlarged mediastinal or axillary lymph nodes. Thyroid gland, trachea, and esophagus demonstrate no significant findings. Lungs/Pleura: No pleural effusion identified. Mild centrilobular emphysema with diffuse bronchial wall thickening. Status post right middle lobectomy. Unchanged subpleural nodule within the medial left lung base measuring 7 mm, image 82/4 and image 125/8. This is been present since 2019 and is favored to represent a benign abnormality. No new lung nodules. Upper Abdomen: No acute abnormality.  Aortic atherosclerosis. Musculoskeletal: No chest wall mass or suspicious bone lesions identified. IMPRESSION: 1. Status post right middle lobectomy. No specific findings identified to suggest residual or recurrent tumor or metastatic disease. 2. Unchanged 7 mm subpleural nodule within the medial left lung base. This is been present since 2019 and is favored to represent a benign abnormality. Continued attention to this nodule on follow-up imaging is recommended 3. Coronary artery calcifications. 4. Diffuse bronchial wall thickening with emphysema, as above; imaging findings suggestive of underlying COPD. 5. Aortic Atherosclerosis (ICD10-I70.0) and Emphysema (ICD10-J43.9). Aortic Atherosclerosis (ICD10-I70.0) and Emphysema (ICD10-J43.9). Electronically Signed   By: Kerby Moors M.D.   On: 01/24/2021 13:23    CT CHEST WO  CONTRAST  Result Date: 01/19/2020 CLINICAL DATA:  Small cell lung cancer post lumpectomy. Annual follow-up. EXAM: CT CHEST WITHOUT CONTRAST TECHNIQUE: Multidetector CT imaging of the chest was performed following the standard protocol without IV contrast. COMPARISON:  07/28/2019 FINDINGS: Cardiovascular: Heart is normal size. There is calcified plaque over the left main and 3 vessel coronary arteries. Mild calcified plaque over the thoracic aorta. Mild ectasia of the ascending thoracic aorta measuring 3.7 cm in  AP diameter without significant change. Mediastinum/Nodes: No significant mediastinal or hilar adenopathy. Remaining mediastinal structures are within normal. Lungs/Pleura: Lungs are adequately inflated as there is evidence of patient's previous right lobectomy. No evidence of pulmonary nodules or masses. Subtle 3 mm triangular opacity over the lingula abutting the major fissure slightly more focal compared to the prior exam and likely benign. No focal airspace process or effusion. Mild emphysematous disease is present. Airways are unremarkable. Upper Abdomen: Stable cyst over the mid pole left kidney. Stable subcentimeter hypodensity over the right lobe of the liver too small to characterize but likely a cyst. Calcified plaque over the abdominal aorta. Musculoskeletal: Unchanged. IMPRESSION: 1. Postsurgical change compatible previous right middle lobectomy for lung cancer. No pulmonary masses or adenopathy. 2. 3 mm triangular nodular density over the lingula abutting the major fissure slightly more focal but likely benign. Recommend attention on follow-up. 3. Mild ectasia of the ascending thoracic aorta measuring 3.7 cm in AP diameter without significant change. Recommend annual imaging followup by CTA or MRA. This recommendation follows 2010 ACCF/AHA/AATS/ACR/ASA/SCA/SCAI/SIR/STS/SVM Guidelines for the Diagnosis and Management of Patients with Thoracic Aortic Disease. Circulation.2010; 121: H631-S970.  Aortic aneurysm NOS (ICD10-I71.9). 4. Aortic Atherosclerosis (ICD10-I70.0) and Emphysema (ICD10-J43.9). Atherosclerotic coronary artery disease. 5. Stable small left renal cyst as well as stable subcentimeter right liver hypodensity too small to characterize but likely a cyst. Electronically Signed   By: Marin Olp M.D.   On: 01/19/2020 14:11     I have independently reviewed the above radiology findings and reviewed findings  with the patient.  Recent Labs: Lab Results  Component Value Date   WBC 6.4 05/08/2018   HGB 11.2 (L) 05/08/2018   HCT 35.0 (L) 05/08/2018   PLT 232 05/08/2018   GLUCOSE 100 (H) 05/08/2018   CHOL 177 02/13/2015   TRIG 113.0 02/13/2015   HDL 73.70 02/13/2015   LDLDIRECT 123.2 12/04/2008   LDLCALC 81 02/13/2015   ALT 16 05/05/2018   AST 34 05/05/2018   NA 141 05/08/2018   K 3.8 05/08/2018   CL 103 05/08/2018   CREATININE 0.93 05/08/2018   BUN 9 05/08/2018   CO2 31 05/08/2018   TSH 1.73 02/13/2015   INR 0.96 04/29/2018      Assessment / Plan:   Stable following right middle lobectomy for stage I a non-small cell carcinoma now almost 2 years postop. -No evidence of recurrence on follow-up CT scan Will obtain follow-up CT in 1 year  Phone call to patient with results of ct scan and recommendation for follow up scan one year   Grace Isaac 01/29/2021 8:21 AM

## 2021-01-24 ENCOUNTER — Ambulatory Visit (INDEPENDENT_AMBULATORY_CARE_PROVIDER_SITE_OTHER): Payer: Medicare Other | Admitting: Cardiothoracic Surgery

## 2021-01-24 ENCOUNTER — Other Ambulatory Visit: Payer: Self-pay

## 2021-01-24 ENCOUNTER — Ambulatory Visit
Admission: RE | Admit: 2021-01-24 | Discharge: 2021-01-24 | Disposition: A | Discharge status: Home / Self Care | Payer: Medicare Other | Source: Ambulatory Visit | Attending: Cardiothoracic Surgery | Admitting: Cardiothoracic Surgery

## 2021-01-24 DIAGNOSIS — Z85118 Personal history of other malignant neoplasm of bronchus and lung: Secondary | ICD-10-CM

## 2021-01-24 DIAGNOSIS — I700 Atherosclerosis of aorta: Secondary | ICD-10-CM | POA: Diagnosis not present

## 2021-01-24 DIAGNOSIS — J432 Centrilobular emphysema: Secondary | ICD-10-CM | POA: Diagnosis not present

## 2021-01-24 DIAGNOSIS — I2510 Atherosclerotic heart disease of native coronary artery without angina pectoris: Secondary | ICD-10-CM | POA: Diagnosis not present

## 2021-01-24 DIAGNOSIS — C3490 Malignant neoplasm of unspecified part of unspecified bronchus or lung: Secondary | ICD-10-CM | POA: Diagnosis not present

## 2021-05-28 ENCOUNTER — Telehealth: Payer: Self-pay | Admitting: Internal Medicine

## 2021-05-28 NOTE — Telephone Encounter (Signed)
LVM for pt to rtn my call to schedule AWV with [facility]. Please schedule this appt if pt calls the office.  °

## 2021-06-04 ENCOUNTER — Other Ambulatory Visit: Payer: Self-pay

## 2021-06-04 ENCOUNTER — Ambulatory Visit (INDEPENDENT_AMBULATORY_CARE_PROVIDER_SITE_OTHER): Payer: Medicare Other | Admitting: Internal Medicine

## 2021-06-04 ENCOUNTER — Ambulatory Visit: Payer: Medicare Other

## 2021-06-04 ENCOUNTER — Encounter: Payer: Self-pay | Admitting: Internal Medicine

## 2021-06-04 VITALS — BP 138/86 | HR 63 | Temp 98.1°F | Ht 60.0 in | Wt 130.0 lb

## 2021-06-04 DIAGNOSIS — I10 Essential (primary) hypertension: Secondary | ICD-10-CM | POA: Diagnosis not present

## 2021-06-04 DIAGNOSIS — R739 Hyperglycemia, unspecified: Secondary | ICD-10-CM | POA: Diagnosis not present

## 2021-06-04 DIAGNOSIS — E7800 Pure hypercholesterolemia, unspecified: Secondary | ICD-10-CM | POA: Diagnosis not present

## 2021-06-04 DIAGNOSIS — E559 Vitamin D deficiency, unspecified: Secondary | ICD-10-CM

## 2021-06-04 DIAGNOSIS — F411 Generalized anxiety disorder: Secondary | ICD-10-CM

## 2021-06-04 DIAGNOSIS — E039 Hypothyroidism, unspecified: Secondary | ICD-10-CM

## 2021-06-04 DIAGNOSIS — E538 Deficiency of other specified B group vitamins: Secondary | ICD-10-CM

## 2021-06-04 LAB — CBC WITH DIFFERENTIAL/PLATELET
Basophils Absolute: 0.1 10*3/uL (ref 0.0–0.1)
Basophils Relative: 0.7 % (ref 0.0–3.0)
Eosinophils Absolute: 0.1 10*3/uL (ref 0.0–0.7)
Eosinophils Relative: 1.4 % (ref 0.0–5.0)
HCT: 39.7 % (ref 36.0–46.0)
Hemoglobin: 13.4 g/dL (ref 12.0–15.0)
Lymphocytes Relative: 32.2 % (ref 12.0–46.0)
Lymphs Abs: 2.8 10*3/uL (ref 0.7–4.0)
MCHC: 33.8 g/dL (ref 30.0–36.0)
MCV: 92.9 fl (ref 78.0–100.0)
Monocytes Absolute: 0.7 10*3/uL (ref 0.1–1.0)
Monocytes Relative: 7.8 % (ref 3.0–12.0)
Neutro Abs: 5.1 10*3/uL (ref 1.4–7.7)
Neutrophils Relative %: 57.9 % (ref 43.0–77.0)
Platelets: 230 10*3/uL (ref 150.0–400.0)
RBC: 4.27 Mil/uL (ref 3.87–5.11)
RDW: 13.7 % (ref 11.5–15.5)
WBC: 8.8 10*3/uL (ref 4.0–10.5)

## 2021-06-04 LAB — TSH: TSH: 43.54 u[IU]/mL — ABNORMAL HIGH (ref 0.35–4.50)

## 2021-06-04 LAB — VITAMIN D 25 HYDROXY (VIT D DEFICIENCY, FRACTURES): VITD: 39.72 ng/mL (ref 30.00–100.00)

## 2021-06-04 LAB — HEMOGLOBIN A1C: Hgb A1c MFr Bld: 6.1 % (ref 4.6–6.5)

## 2021-06-04 LAB — VITAMIN B12: Vitamin B-12: 1390 pg/mL — ABNORMAL HIGH (ref 211–911)

## 2021-06-04 NOTE — Assessment & Plan Note (Signed)
Lab Results  Component Value Date   LDLCALC 81 02/13/2015   Stable, pt to continue current statin lipitor

## 2021-06-04 NOTE — Assessment & Plan Note (Signed)
Stable, declines need for change in tx

## 2021-06-04 NOTE — Assessment & Plan Note (Signed)
Lab Results  Component Value Date   TSH 1.73 02/13/2015   Stable, pt to continue levothyroxine

## 2021-06-04 NOTE — Assessment & Plan Note (Signed)
Last vitamin D Lab Results  Component Value Date   VD25OH 27 (L) 06/09/2012   Low to start oral replacement

## 2021-06-04 NOTE — Assessment & Plan Note (Signed)
No results found for: HGBA1C Stable, pt to continue current medical treatment  - diet
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
## 2021-06-04 NOTE — Assessment & Plan Note (Signed)
BP Readings from Last 3 Encounters:  06/04/21 138/86  07/17/20 (!) 154/88  04/03/20 (!) 150/90   Improved,, pt to continue medical treatment lisiopril, toprol

## 2021-06-04 NOTE — Patient Instructions (Signed)

## 2021-06-05 ENCOUNTER — Other Ambulatory Visit: Payer: Self-pay | Admitting: Internal Medicine

## 2021-06-05 ENCOUNTER — Encounter: Payer: Self-pay | Admitting: Internal Medicine

## 2021-06-05 LAB — HEPATIC FUNCTION PANEL
ALT: 9 U/L (ref 0–35)
AST: 15 U/L (ref 0–37)
Albumin: 3.9 g/dL (ref 3.5–5.2)
Alkaline Phosphatase: 65 U/L (ref 39–117)
Bilirubin, Direct: 0 mg/dL (ref 0.0–0.3)
Total Bilirubin: 0.5 mg/dL (ref 0.2–1.2)
Total Protein: 7 g/dL (ref 6.0–8.3)

## 2021-06-05 LAB — URINALYSIS, ROUTINE W REFLEX MICROSCOPIC
Bilirubin Urine: NEGATIVE
Hgb urine dipstick: NEGATIVE
Ketones, ur: NEGATIVE
Leukocytes,Ua: NEGATIVE
Nitrite: NEGATIVE
RBC / HPF: NONE SEEN (ref 0–?)
Specific Gravity, Urine: >=1.03 — AB (ref 1.000–1.030)
Total Protein, Urine: NEGATIVE
Urine Glucose: NEGATIVE
Urobilinogen, UA: 0.2 (ref 0.0–1.0)
pH: 5.5 (ref 5.0–8.0)

## 2021-06-05 LAB — BASIC METABOLIC PANEL
BUN: 22 mg/dL (ref 6–23)
CO2: 26 mEq/L (ref 19–32)
Calcium: 9.9 mg/dL (ref 8.4–10.5)
Chloride: 106 mEq/L (ref 96–112)
Creatinine, Ser: 1.02 mg/dL (ref 0.40–1.20)
GFR: 49.95 mL/min — ABNORMAL LOW (ref >60.00)
Glucose, Bld: 80 mg/dL (ref 70–99)
Potassium: 4.4 mEq/L (ref 3.5–5.1)
Sodium: 142 mEq/L (ref 135–145)

## 2021-06-05 LAB — LIPID PANEL
Cholesterol: 276 mg/dL — ABNORMAL HIGH (ref 0–200)
HDL: 64.4 mg/dL (ref >39.00)
LDL Cholesterol: 173 mg/dL — ABNORMAL HIGH (ref 0–99)
NonHDL: 211.45
Total CHOL/HDL Ratio: 4
Triglycerides: 194 mg/dL — ABNORMAL HIGH (ref 0.0–149.0)
VLDL: 38.8 mg/dL (ref 0.0–40.0)

## 2021-06-05 MED ORDER — MONTELUKAST SODIUM 10 MG PO TABS: 1.0000 | ORAL_TABLET | Freq: Every day | ORAL | 3 refills | Status: AC

## 2021-06-05 MED ORDER — LEVOTHYROXINE SODIUM 75 MCG PO TABS: 75.0000 ug | ORAL_TABLET | Freq: Every day | ORAL | 3 refills | Status: AC

## 2021-06-05 MED ORDER — ATORVASTATIN CALCIUM 40 MG PO TABS: 40.0000 mg | ORAL_TABLET | Freq: Every day | ORAL | 3 refills | Status: AC

## 2021-06-05 MED ORDER — METOPROLOL SUCCINATE ER 25 MG PO TB24: 25.0000 mg | ORAL_TABLET | Freq: Every day | ORAL | 3 refills | Status: AC

## 2021-07-31 ENCOUNTER — Telehealth: Payer: Self-pay | Admitting: Emergency Medicine

## 2021-07-31 NOTE — Telephone Encounter (Signed)
Pt is requesting a print out of her vaccination records. Can you print them out and give her a call when its ready for pick up thanks.

## 2021-08-05 NOTE — Telephone Encounter (Signed)
Patient is calling back bc she never got a callback reagrding picking up a copy of her vaccination record  Patient says she needs this emailed to her within the next 2-3 hours bc she is going on a trip & can not board w/ out it.  Please email to: [email or web address]  Callback # [phone]

## 2021-08-05 NOTE — Telephone Encounter (Signed)
Matters have been taken care of

## 2021-12-03 ENCOUNTER — Other Ambulatory Visit: Payer: Self-pay | Admitting: *Deleted

## 2021-12-03 DIAGNOSIS — Z85118 Personal history of other malignant neoplasm of bronchus and lung: Secondary | ICD-10-CM

## 2021-12-03 NOTE — Progress Notes (Unsigned)
Ct c 

## 2022-01-31 ENCOUNTER — Other Ambulatory Visit: Payer: Medicare Other

## 2022-02-04 ENCOUNTER — Ambulatory Visit: Payer: Medicare Other | Admitting: Thoracic Surgery (Cardiothoracic Vascular Surgery)

## 2022-03-24 ENCOUNTER — Telehealth: Payer: Self-pay | Admitting: Internal Medicine

## 2022-03-24 NOTE — Telephone Encounter (Signed)
LVM for pt to rtn my call to schedule AWV with [facility]. Please schedule AWV if pt calls the office  

## 2022-08-27 ENCOUNTER — Telehealth: Payer: Self-pay | Admitting: Internal Medicine

## 2022-08-27 NOTE — Telephone Encounter (Signed)
Patient's daughter called stating that patient is traveling through [facility] and left her blood pressure medication on the plane. Is hoping to get a short fill of medication - she will be here for 1 wk - daughter does not know the name of the blood pressure medication. Call back number is [facility] #[phone] - [place], [place] [address] AT [place]

## 2022-08-28 NOTE — Telephone Encounter (Signed)
Ok to send

## 2022-09-03 ENCOUNTER — Telehealth: Payer: Self-pay

## 2022-09-03 NOTE — Telephone Encounter (Signed)
Left detail message for pt to call back to schedule an appointment for any further medications to be refill
